# Patient Record
Sex: Male | Born: 1970
Health system: Southern US, Community
[De-identification: ages and names within clinical notes are randomized; demographics above are authoritative.]

## PROBLEM LIST (undated history)

## (undated) DIAGNOSIS — R12 Heartburn: Secondary | ICD-10-CM

## (undated) DIAGNOSIS — K219 Gastro-esophageal reflux disease without esophagitis: Secondary | ICD-10-CM

## (undated) DIAGNOSIS — M542 Cervicalgia: Secondary | ICD-10-CM

## (undated) HISTORY — PX: COLONOSCOPY: SHX174

## (undated) HISTORY — DX: Heartburn: R12

## (undated) HISTORY — PX: ESOPHAGOGASTRODUODENOSCOPY: SHX1529

## (undated) HISTORY — DX: Cervicalgia: M54.2

## (undated) HISTORY — PX: HERNIA REPAIR: SHX51

## (undated) HISTORY — DX: Gastro-esophageal reflux disease without esophagitis: K21.9

---

## 2011-08-20 ENCOUNTER — Ambulatory Visit (HOSPITAL_BASED_OUTPATIENT_CLINIC_OR_DEPARTMENT_OTHER)
Admission: RE | Admit: 2011-08-20 | Discharge: 2011-08-20 | Disposition: A | Discharge status: Home / Self Care | Payer: Managed Care, Other (non HMO) | Source: Ambulatory Visit | Attending: Family Medicine | Admitting: Family Medicine

## 2011-08-20 ENCOUNTER — Other Ambulatory Visit (HOSPITAL_BASED_OUTPATIENT_CLINIC_OR_DEPARTMENT_OTHER): Payer: Self-pay | Admitting: Family Medicine

## 2011-08-20 DIAGNOSIS — R52 Pain, unspecified: Secondary | ICD-10-CM

## 2011-08-20 DIAGNOSIS — R079 Chest pain, unspecified: Secondary | ICD-10-CM

## 2011-08-20 DIAGNOSIS — M25539 Pain in unspecified wrist: Secondary | ICD-10-CM | POA: Insufficient documentation

## 2011-08-20 DIAGNOSIS — R209 Unspecified disturbances of skin sensation: Secondary | ICD-10-CM

## 2011-08-20 DIAGNOSIS — R0789 Other chest pain: Secondary | ICD-10-CM | POA: Insufficient documentation

## 2011-09-10 ENCOUNTER — Ambulatory Visit (HOSPITAL_BASED_OUTPATIENT_CLINIC_OR_DEPARTMENT_OTHER)
Admission: RE | Admit: 2011-09-10 | Discharge: 2011-09-10 | Disposition: A | Discharge status: Home / Self Care | Payer: Managed Care, Other (non HMO) | Source: Ambulatory Visit | Attending: Family Medicine | Admitting: Family Medicine

## 2011-09-10 ENCOUNTER — Other Ambulatory Visit (HOSPITAL_BASED_OUTPATIENT_CLINIC_OR_DEPARTMENT_OTHER): Payer: Self-pay | Admitting: Family Medicine

## 2011-09-10 DIAGNOSIS — R1031 Right lower quadrant pain: Secondary | ICD-10-CM

## 2011-09-10 DIAGNOSIS — R1909 Other intra-abdominal and pelvic swelling, mass and lump: Secondary | ICD-10-CM | POA: Insufficient documentation

## 2012-12-29 IMAGING — CT CT ABD-PELV W/O CM
2 of 4 series · 15 of 46 positions shown, 17 images · non-contrast
Comparison: None.

CLINICAL DATA: Abdominal pain, right lower quadrant pain

CT ABDOMEN AND PELVIS WITHOUT CONTRAST
TECHNIQUE: Multidetector CT imaging of the abdomen and pelvis was
performed following the standard protocol without intravenous
contrast.

[Series 2: renal stone < 200 lbs 5.0 b31f · axial · 0.85mm/px · z∈[-514,-34]mm · 12 of 106 slices shown, 14 images]
[im 5/106  soft-tissue]
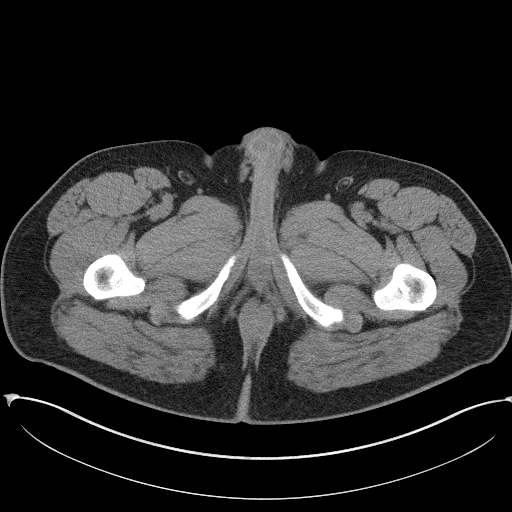
[im 5/106  bone]
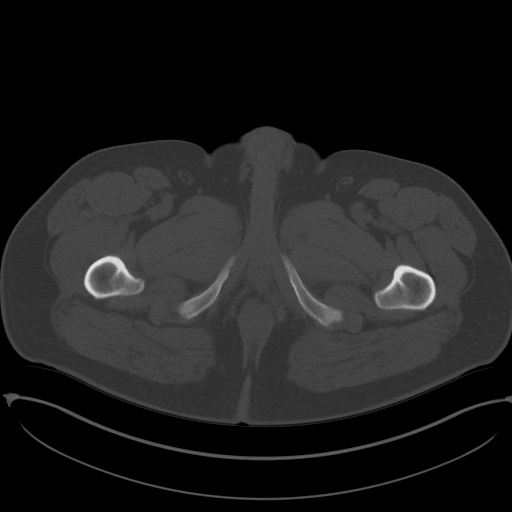
[im 14/106  soft-tissue]
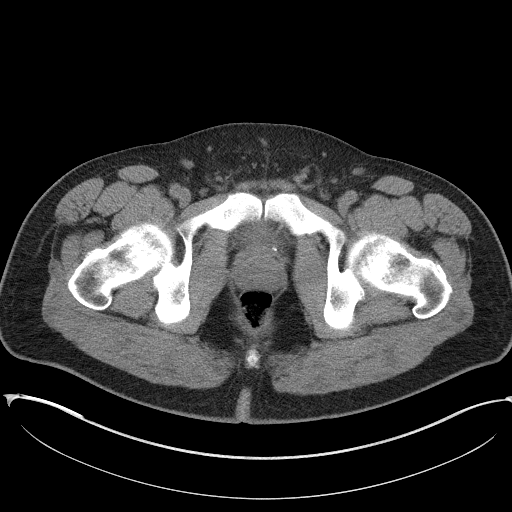
[im 22/106  soft-tissue]
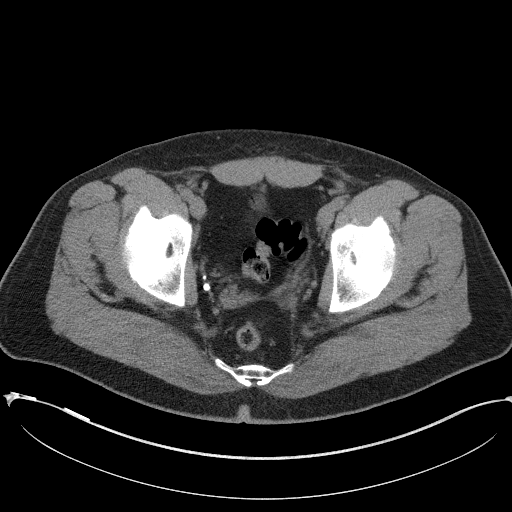
[im 31/106  soft-tissue]
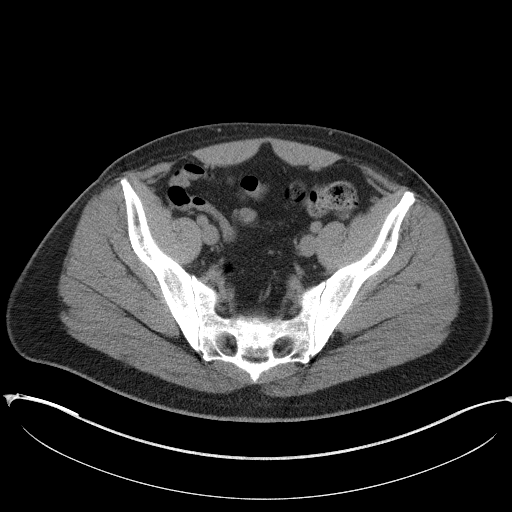
[im 40/106  soft-tissue]
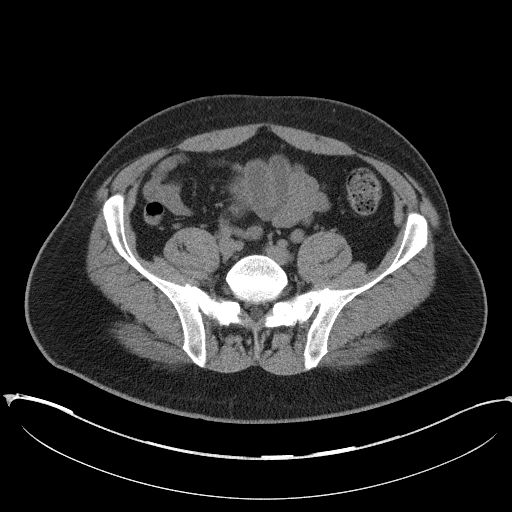
[im 49/106  soft-tissue]
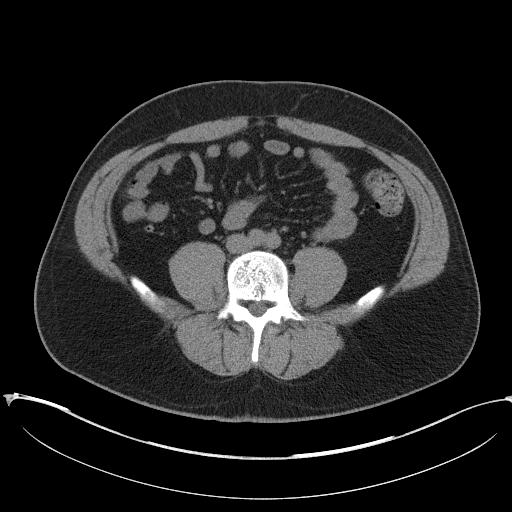
[im 57/106  soft-tissue]
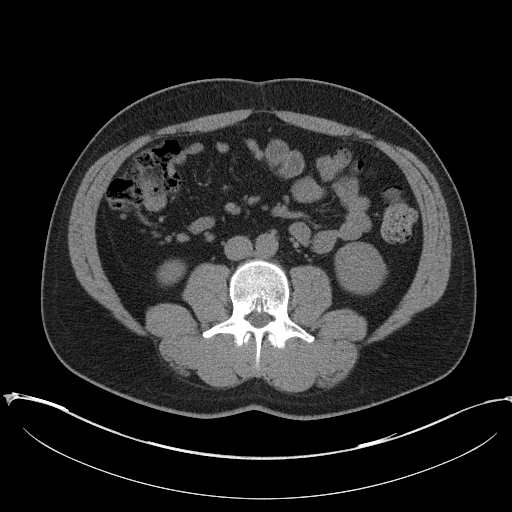
[im 66/106  soft-tissue]
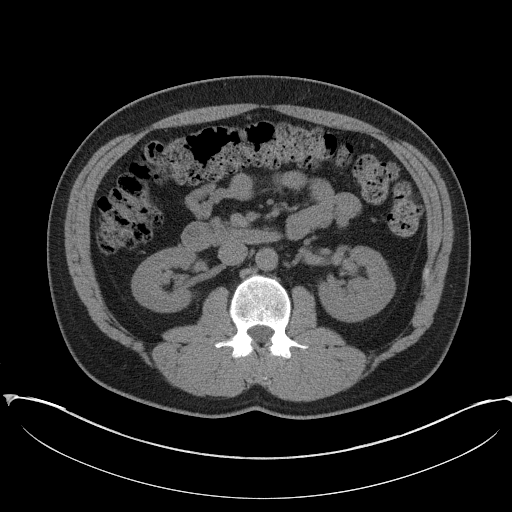
[im 75/106  soft-tissue]
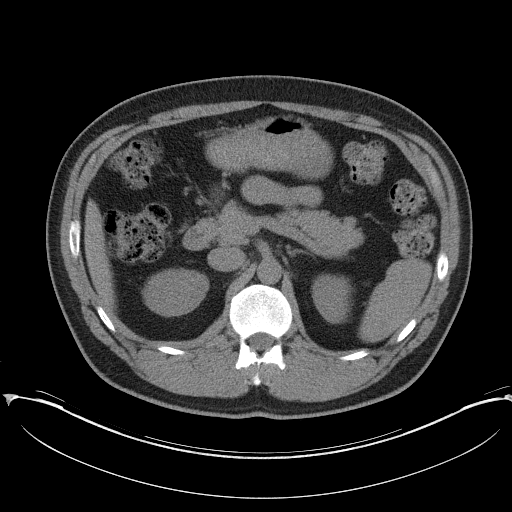
[im 75/106  bone]
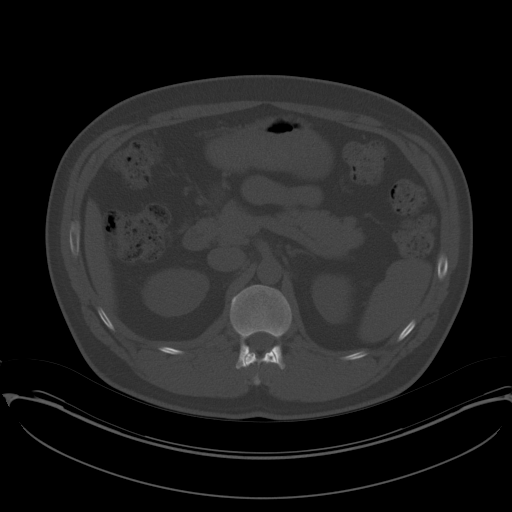
[im 84/106  soft-tissue]
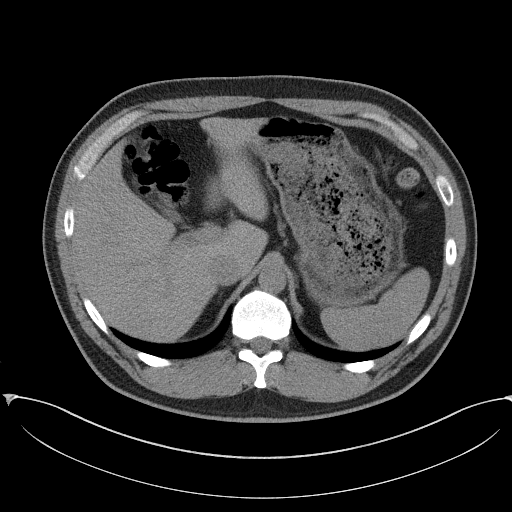
[im 92/106  soft-tissue]
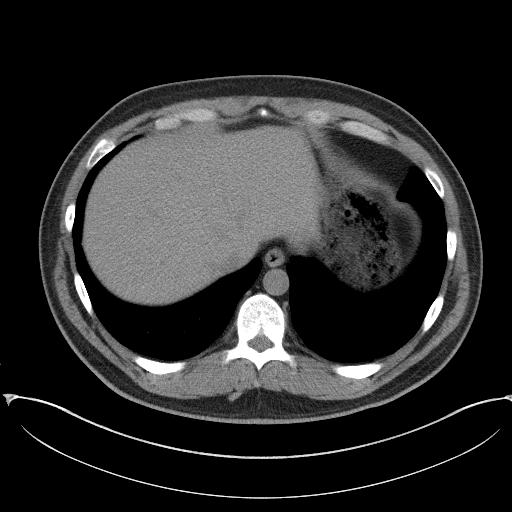
[im 101/106  soft-tissue]
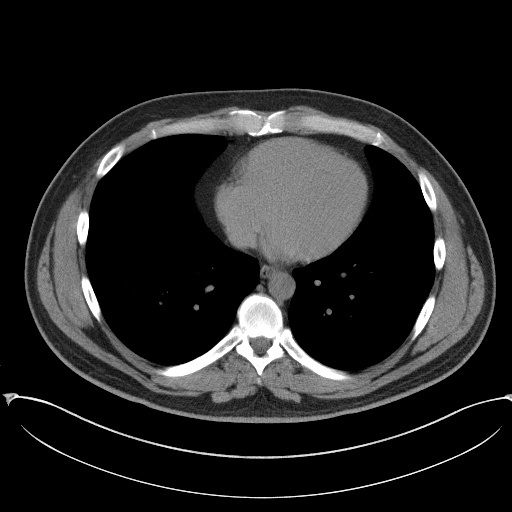

[Series 5: renal stone 3.0 coronal · coronal · 0.97mm/px · 3 of 84 slices shown]
[im 28/84  soft-tissue]
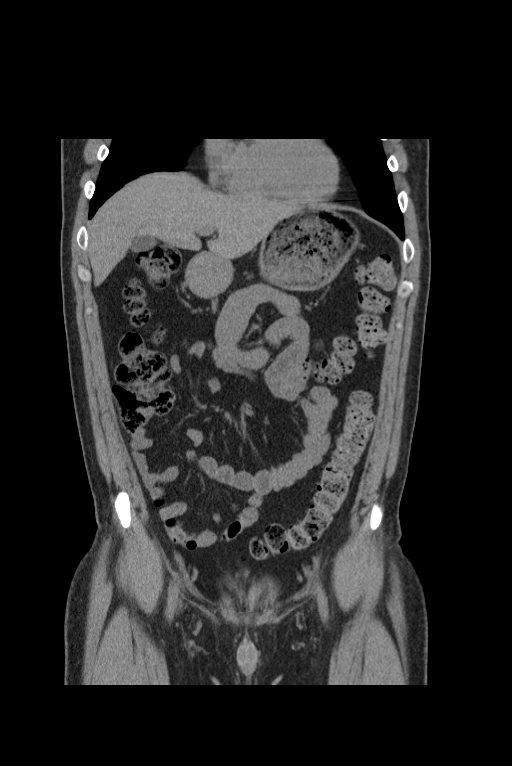
[im 37/84  soft-tissue]
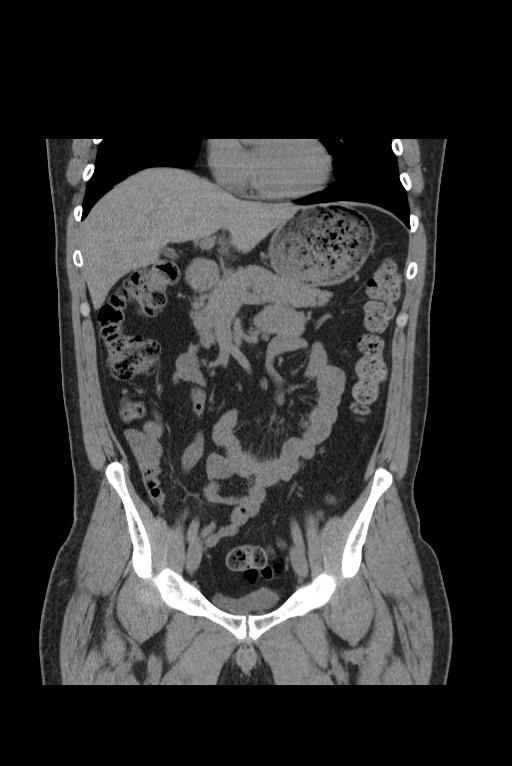
[im 47/84  soft-tissue]
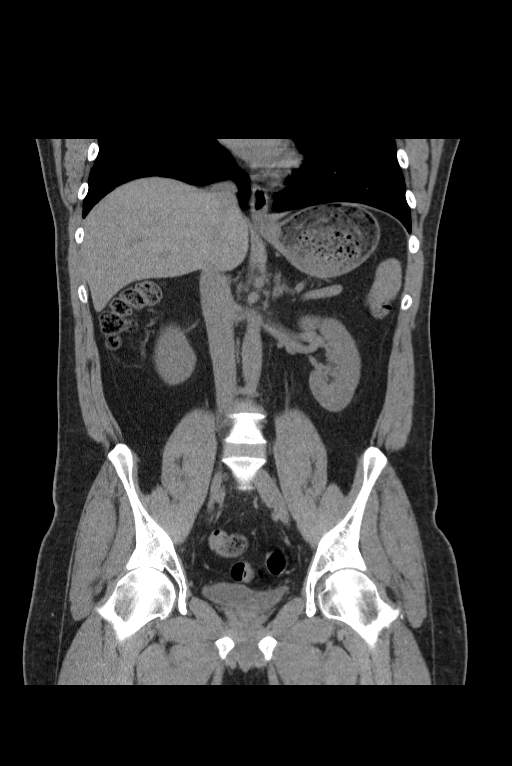

[15 of 46 positions shown; findings below may reference images not displayed]

FINDINGS: Lung bases are unremarkable.  Sagittal images of the
spine are unremarkable.  Mild disc space flattening at L5 S1 level.

Unenhanced liver is unremarkable.  Gallbladder is contracted
without evidence of calcified gallstones.  The pancreas, spleen and
adrenal glands are unremarkable.  The moderate debris probable
recent ingested food noted within stomach.

Moderate stool throughout the colon.  No aortic aneurysm.  The
unenhanced kidneys are symmetrical in size.  No nephrolithiasis.
No hydronephrosis or hydroureter.

There is a nonspecific nodule within the left upper quadrant just
lateral to the stomach and anterior to the spleen measures 6.5 mm.
This may represent a small lymph node or a small accessory
splenule.  Cannot be characterized without IV contrast.

No calcified ureteral calculi are noted bilaterally.  There is no
pericecal inflammation.  Normal appendix is clearly visualized
axial image 59.  No ascites or free air.  No small bowel
obstruction.  No adenopathy. Small nonspecific lymph nodes are
noted in the right lower quadrant mesentery the largest measures 9
x 7 mm.

The urinary bladder is under distended grossly unremarkable.
Bilateral nonspecific inguinal lymph nodes are noted.  The largest
right inguinal lymph node measures 1.2 x 0.9 cm.  The largest left
inguinal lymph node measures 1.3 x 1 cm.  Small left inguinal
scrotal canal hernia containing fat.  No destructive bony lesions
are noted within pelvis.
IMPRESSION: 1.  No nephrolithiasis.  No hydronephrosis or hydroureter.
2.  Bilateral no calcified ureteral calculi are noted.
3.  Normal appendix is clearly visualized.
4.  Moderate stool throughout the colon.
5.  There is nonspecific soft tissue nodule in the left upper
quadrant anterior to the spleen and lateral to the stomach measures
6.5 mm.  This may represent a lymph node or a small accessory
splenule.  Cannot be characterized without IV contrast.

## 2015-06-16 DIAGNOSIS — K219 Gastro-esophageal reflux disease without esophagitis: Secondary | ICD-10-CM | POA: Insufficient documentation

## 2015-06-16 DIAGNOSIS — Z800 Family history of malignant neoplasm of digestive organs: Secondary | ICD-10-CM | POA: Insufficient documentation

## 2015-07-16 DIAGNOSIS — E7800 Pure hypercholesterolemia, unspecified: Secondary | ICD-10-CM | POA: Insufficient documentation

## 2016-07-21 ENCOUNTER — Ambulatory Visit (INDEPENDENT_AMBULATORY_CARE_PROVIDER_SITE_OTHER): Payer: 59 | Admitting: Family Medicine

## 2016-07-21 ENCOUNTER — Encounter: Payer: Self-pay | Admitting: Family Medicine

## 2016-07-21 DIAGNOSIS — M542 Cervicalgia: Secondary | ICD-10-CM

## 2016-07-21 MED ORDER — METHOCARBAMOL 500 MG PO TABS: 500.0000 mg | ORAL_TABLET | Freq: Three times a day (TID) | ORAL | 1 refills | Status: DC | PRN

## 2016-07-21 MED ORDER — PREDNISONE 10 MG PO TABS: ORAL_TABLET | ORAL | 0 refills | Status: DC

## 2016-07-21 NOTE — Patient Instructions (Signed)
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 Start with prednisone dose pack - take to completion even if you're feeling better. Day AFTER finishing prednisone you can take aleve 2 tabs twice a day with food OR ibuprofen 600mg  three times a day with food for pain and inflammation. Robaxin as needed for muscle spasms - if this makes you sleepy don't take it during the day. Ok to take tylenol in addition to the above. Simple motion exercises (neck rolls, flexion/extension) just to keep this from becoming too stiff. Heat 15 minutes at a time 3-4 times a day. Call me in a week to let me know how you're doing. If not improving would consider imaging (x-rays, MRI). If improving we will consider whether or not to do physical therapy - given the nerve involved it may not help you as much as it would if this was from a pinched cervical nerve. Follow up with me in 1 month if you're doing well.

## 2016-07-22 DIAGNOSIS — M542 Cervicalgia: Secondary | ICD-10-CM | POA: Insufficient documentation

## 2016-07-22 NOTE — Progress Notes (Signed)
PCP: Nilda Simmer, MD  Subjective:   HPI: Patient is a 46 y.o. male here for right neck pain.  Patient denies known injury. He recalls working out about 6 weeks ago and noticed pain in right side of neck, collar bone the day after. Thought maybe he slept wrong. Pain has continued at 5/10 level, sharp. When he works out his program includes a lot of burpees. No radiation into arms. Has not tried anything for this. No numbness or tingling. Has been resting. No pain with swallowing, breathing. Pain worse by end of day and feels with turning neck. Causes headache as well. No bowel/bladder dysfunction.  No past medical history on file.  No current outpatient prescriptions on file prior to visit.   No current facility-administered medications on file prior to visit.     No past surgical history on file.  No Known Allergies  Social History   Social History  . Marital status: Married    Spouse name: N/A  . Number of children: N/A  . Years of education: N/A   Occupational History  . Not on file.   Social History Main Topics  . Smoking status: Never Smoker  . Smokeless tobacco: Never Used  . Alcohol use Not on file  . Drug use: Unknown  . Sexual activity: Not on file   Other Topics Concern  . Not on file   Social History Narrative  . No narrative on file    No family history on file.  BP 118/77   Pulse (!) 58   Ht 6' (1.829 m)   Wt 212 lb (96.2 kg)   BMI 28.75 kg/m   Review of Systems: See HPI above.     Objective:  Physical Exam:  Gen: NAD, comfortable in exam room  Neck: No gross deformity, swelling, bruising. TTP over trapezius, right sternocleidomastoid.  No midline/bony TTP. FROM neck - pain right lateral rotation. BUE strength 5/5.   Sensation intact to light touch.   1+ equal reflexes in triceps, biceps, brachioradialis tendons. Negative spurlings. NV intact distal BUEs.  Right shoulder: No swelling, ecchymoses.  No gross deformity. No  TTP. FROM. Negative Hawkins, Neers. Negative Yergasons. Strength 5/5 with empty can and resisted internal/external rotation. Negative apprehension. NV intact distally.   Assessment & Plan:  1. 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  Reassured.  Will start prednisone dose pack then transition to ibuprofen or aleve.  Robaxin if needed for spasms.  Simple motion exercises, heat.  Imaging if not improving over next week of the cervical spine.  F/u in 1 month otherwise.

## 2016-07-22 NOTE — Assessment & Plan Note (Signed)
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  Reassured.  Will start prednisone dose pack then transition to ibuprofen or aleve.  Robaxin if needed for spasms.  Simple motion exercises, heat.  Imaging if not improving over next week of the cervical spine.  F/u in 1 month otherwise.

## 2016-08-03 ENCOUNTER — Ambulatory Visit (INDEPENDENT_AMBULATORY_CARE_PROVIDER_SITE_OTHER): Payer: 59 | Admitting: Family Medicine

## 2016-08-03 ENCOUNTER — Encounter: Payer: Self-pay | Admitting: Family Medicine

## 2016-08-03 VITALS — BP 126/74 | HR 65 | Ht 72.0 in | Wt 212.0 lb

## 2016-08-03 DIAGNOSIS — M542 Cervicalgia: Secondary | ICD-10-CM

## 2016-08-03 NOTE — Patient Instructions (Signed)
I would recommend either physical therapy for 4-6 weeks then reevaluation. Other option would be imaging - x-rays of neck followed by MRI to assess for abnormalities (disc herniation hitting a nerve, nerve entrapment, anything unusual like a lymph node which I think would be very unlikely given your exam).

## 2016-08-04 NOTE — Assessment & Plan Note (Signed)
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  Very mild improvement initially but pain still fairly severe.  No palpable lymph nodes.  Discussed options - he would like to go ahead with MRI of cervical spine to assess for nerve impingement first.  We discussed two MRIs would likely be necessary to cover entire area of his pain (chest wall being second one).  I suspect MRIs will be normal with current symptoms.  If this is normal he would like to go ahead with physical therapy.  Robaxin if needed for spasms.  Simple motion exercises, heat.

## 2016-08-04 NOTE — Progress Notes (Addendum)
PCP: Nilda Simmer, MD  Subjective:   HPI: Patient is a 46 y.o. male here for right neck pain.  4/4: Patient denies known injury. He recalls working out about 6 weeks ago and noticed pain in right side of neck, collar bone the day after. Thought maybe he slept wrong. Pain has continued at 5/10 level, sharp. When he works out his program includes a lot of burpees. No radiation into arms. Has not tried anything for this. No numbness or tingling. Has been resting. No pain with swallowing, breathing. Pain worse by end of day and feels with turning neck. Causes headache as well. No bowel/bladder dysfunction.  4/17: Patient reports he feels a little better. Pain was improved first day especially with prednisone. Tried robaxin, still using heat. Pain is 2/10 at rest, up to 7/10 and sharp at worst. Pain posterior neck has improved - feels mainly behind right clavicle. Worse with reaching behind back with right arm but pain again behind medial right clavicle. No skin changes, numbness. No radiation into arm. No bowel/bladder dysfunction.  No past medical history on file.  Current Outpatient Prescriptions on File Prior to Visit  Medication Sig Dispense Refill  . DEXILANT 60 MG capsule     . methocarbamol (ROBAXIN) 500 MG tablet Take 1 tablet (500 mg total) by mouth every 8 (eight) hours as needed. 60 tablet 1  . omeprazole (PRILOSEC) 40 MG capsule Take by mouth.    . predniSONE (DELTASONE) 10 MG tablet 6 tabs po day 1, 5 tabs po day 2, 4 tabs po day 3, 3 tabs po day 4, 2 tabs po day 5, 1 tab po day 6 21 tablet 0   No current facility-administered medications on file prior to visit.     No past surgical history on file.  No Known Allergies  Social History   Social History  . Marital status: Married    Spouse name: N/A  . Number of children: N/A  . Years of education: N/A   Occupational History  . Not on file.   Social History Main Topics  . Smoking status: Never Smoker   . Smokeless tobacco: Never Used  . Alcohol use Not on file  . Drug use: Unknown  . Sexual activity: Not on file   Other Topics Concern  . Not on file   Social History Narrative  . No narrative on file    No family history on file.  BP 126/74   Pulse 65   Ht 6' (1.829 m)   Wt 212 lb (96.2 kg)   BMI 28.75 kg/m   Review of Systems: See HPI above.     Objective:  Physical Exam:  Gen: NAD, comfortable in exam room  Neck: No gross deformity, swelling, bruising. TTP right sternocleidomastoid, less right trapezius.  No midline/bony TTP. FROM neck - pain right lateral rotation. BUE strength 5/5.   Sensation intact to light touch.   NV intact distal BUEs.  Right shoulder: No swelling, ecchymoses.  No gross deformity. No TTP. FROM. Negative Hawkins, Neers. Negative Yergasons. Strength 5/5 with empty can and resisted internal/external rotation. NV intact distally.   Assessment & Plan:  1. 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  Very mild improvement initially but pain still fairly severe.  No palpable lymph nodes.  Discussed options - he would like to go ahead with MRI of cervical spine to assess for nerve impingement first.  We discussed two MRIs  would likely be necessary to cover entire area of his pain (chest wall being second one).  I suspect MRIs will be normal with current symptoms.  If this is normal he would like to go ahead with physical therapy.  Robaxin if needed for spasms.  Simple motion exercises, heat.    Addendum:  MRI reviewed and discussed with patient.  No evidence cervical nerve impingement.  Visualized portion of clavicle, SCM, trapezius muscles appear normal as well.  No evidence enlarged lymph nodes.  Reassured him - consistent still with stretch injury of accessory nerve.  He will start physical therapy with plans to see him back in about 6 weeks.

## 2016-09-06 ENCOUNTER — Ambulatory Visit (INDEPENDENT_AMBULATORY_CARE_PROVIDER_SITE_OTHER): Payer: 59

## 2016-09-06 DIAGNOSIS — M5031 Other cervical disc degeneration,  high cervical region: Secondary | ICD-10-CM | POA: Diagnosis not present

## 2016-09-06 DIAGNOSIS — M542 Cervicalgia: Secondary | ICD-10-CM

## 2017-05-10 DIAGNOSIS — H6123 Impacted cerumen, bilateral: Secondary | ICD-10-CM | POA: Diagnosis not present

## 2017-05-10 DIAGNOSIS — E785 Hyperlipidemia, unspecified: Secondary | ICD-10-CM | POA: Diagnosis not present

## 2017-05-10 DIAGNOSIS — Z0000 Encounter for general adult medical examination without abnormal findings: Secondary | ICD-10-CM | POA: Diagnosis not present

## 2017-05-16 ENCOUNTER — Ambulatory Visit: Payer: 59 | Admitting: Family Medicine

## 2017-05-16 ENCOUNTER — Encounter: Payer: Self-pay | Admitting: Family Medicine

## 2017-05-16 DIAGNOSIS — M542 Cervicalgia: Secondary | ICD-10-CM | POA: Diagnosis not present

## 2017-05-16 NOTE — Patient Instructions (Addendum)
Start physical therapy and do home exercises on days you don't go to therapy. Follow up with me in 1 month to 6 weeks for reevaluation. Your exam is very reassuring despite your pain. I would consider imaging of your chest wall/clavicle as next step if you're not improving.

## 2017-05-16 NOTE — Assessment & Plan Note (Signed)
Patient's exam is again reassuring.  Unusual that his pain persists along with location.  His MRI of the neck didn't show evidence of abnormality to explain his symptoms.  Would expect stretch injury of accessory nerve to have resolved by now.  We discussed further imaging (MRI of chest wall to include clavicle), physical therapy - he will try physical therapy as next step.  No evidence thoracic outlet syndrome.  Tylenol, ibuprofen if needed.  F/u in 1 month to 6 weeks.

## 2017-05-16 NOTE — Progress Notes (Signed)
PCP: [name], MD  Subjective:   HPI: Patient is a 47 y.o. male here for right neck pain.  4/4: Patient denies known injury. He recalls working out about 6 weeks ago and noticed pain in right side of neck, collar bone the day after. Thought maybe he slept wrong. Pain has continued at 5/10 level, sharp. When he works out his program includes a lot of burpees. No radiation into arms. Has not tried anything for this. No numbness or tingling. Has been resting. No pain with swallowing, breathing. Pain worse by end of day and feels with turning neck. Causes headache as well. No bowel/bladder dysfunction.  08/03/16: Patient reports he feels a little better. Pain was improved first day especially with prednisone. Tried robaxin, still using heat. Pain is 2/10 at rest, up to 7/10 and sharp at worst. Pain posterior neck has improved - feels mainly behind right clavicle. Worse with reaching behind back with right arm but pain again behind medial right clavicle. No skin changes, numbness. No radiation into arm. No bowel/bladder dysfunction.  05/16/17: Patient returns reporting he has not improved since seeing [place] last April. Pain level is 3-5/10 on right side of neck, sharp. Also pain behind right clavicle, in right pectoralis up to 3-5/10 level as well. Worse with motion of the neck especially turning to right side. No numbness, tingling into arms. No sweats, chills, fevers. No abnormal masses, swelling.  History reviewed. No pertinent past medical history.  Current Outpatient Medications on File Prior to Visit  Medication Sig Dispense Refill  . atorvastatin (LIPITOR) 20 MG tablet Take 20 mg by mouth daily.  5  . DEXILANT 60 MG capsule     . methocarbamol (ROBAXIN) 500 MG tablet Take 1 tablet (500 mg total) by mouth every 8 (eight) hours as needed. 60 tablet 1  . omeprazole (PRILOSEC) 40 MG capsule Take by mouth.    . predniSONE (DELTASONE) 10 MG tablet 6 tabs po day 1, 5  tabs po day 2, 4 tabs po day 3, 3 tabs po day 4, 2 tabs po day 5, 1 tab po day 6 21 tablet 0   No current facility-administered medications on file prior to visit.     History reviewed. No pertinent surgical history.  No Known Allergies  Social History   Socioeconomic History  . Marital status: Married    Spouse name: Not on file  . Number of children: Not on file  . Years of education: Not on file  . Highest education level: Not on file  Social Needs  . Financial resource strain: Not on file  . Food insecurity - worry: Not on file  . Food insecurity - inability: Not on file  . Transportation needs - medical: Not on file  . Transportation needs - non-medical: Not on file  Occupational History  . Not on file  Tobacco Use  . Smoking status: Never Smoker  . Smokeless tobacco: Never Used  Substance and Sexual Activity  . Alcohol use: Not on file  . Drug use: Not on file  . Sexual activity: Not on file  Other Topics Concern  . Not on file  Social History Narrative  . Not on file    History reviewed. No pertinent family history.  BP 121/75   Pulse 68   Ht 6' (1.829 m)   Wt 220 lb (99.8 kg)   BMI 29.84 kg/m   Review of Systems: See HPI above.     Objective:  Physical Exam:  Gen: NAD comfortable in exam room.  Neck: No gross deformity, swelling, bruising.  Climax joint slightly more prominent right side but no erythema, tenderness, warmth. TTP right trapezius, less of clavicle and superior pec major.  No midline/bony TTP. FROM with pain on right lateral rotation mainly. BUE strength 5/5.   Sensation intact to light touch.   1+ equal reflexes in triceps, biceps, brachioradialis tendons. Negative spurlings. NV intact distal BUEs.  Right shoulder: No swelling, ecchymoses.  No gross deformity. No TTP. FROM. Negative Hawkins, Neers. Negative Yergasons. Negative adsons. Strength 5/5 with empty can and resisted internal/external rotation. Negative  apprehension. NV intact distally.   Lymph:  No axillary, supraclavicular, epitrochlear, cervical lymphadenopathy.  Assessment & Plan:  1. Neck pain - Patient's exam is again reassuring.  Unusual that his pain persists along with location.  His MRI of the neck didn't show evidence of abnormality to explain his symptoms.  Would expect stretch injury of accessory nerve to have resolved by now.  We discussed further imaging (MRI of chest wall to include clavicle), physical therapy - he will try physical therapy as next step.  No evidence thoracic outlet syndrome.  Tylenol, ibuprofen if needed.  F/u in 1 month to 6 weeks.

## 2017-05-24 DIAGNOSIS — R293 Abnormal posture: Secondary | ICD-10-CM | POA: Diagnosis not present

## 2017-05-24 DIAGNOSIS — M6281 Muscle weakness (generalized): Secondary | ICD-10-CM | POA: Diagnosis not present

## 2017-05-24 DIAGNOSIS — M542 Cervicalgia: Secondary | ICD-10-CM | POA: Diagnosis not present

## 2017-05-26 DIAGNOSIS — M542 Cervicalgia: Secondary | ICD-10-CM | POA: Diagnosis not present

## 2017-05-26 DIAGNOSIS — M6281 Muscle weakness (generalized): Secondary | ICD-10-CM | POA: Diagnosis not present

## 2017-05-26 DIAGNOSIS — R293 Abnormal posture: Secondary | ICD-10-CM | POA: Diagnosis not present

## 2017-06-02 DIAGNOSIS — R293 Abnormal posture: Secondary | ICD-10-CM | POA: Diagnosis not present

## 2017-06-02 DIAGNOSIS — M6281 Muscle weakness (generalized): Secondary | ICD-10-CM | POA: Diagnosis not present

## 2017-06-02 DIAGNOSIS — M542 Cervicalgia: Secondary | ICD-10-CM | POA: Diagnosis not present

## 2017-08-11 ENCOUNTER — Telehealth: Payer: Self-pay | Admitting: Family Medicine

## 2017-08-11 NOTE — Telephone Encounter (Signed)
Patient called stating his neck is not doing any better. Wants to know if he can be referred to a spine specialist

## 2017-08-12 NOTE — Telephone Encounter (Signed)
I wouldn't recommend he see a neurosurgeon (typically these are the spine specialists) - he had an MRI of his neck that didn't show a cause for his pain where a shot or surgery by them would help.  I would go one of two routes: 1. We discussed imaging of his chest (MRI of the chest which would include the clavicle) 2. Neurology consult - this may include nerve testing from his neck into his arm, investigate other non-ortho/spine causes of his pain.

## 2017-08-12 NOTE — Telephone Encounter (Signed)
Voice mailbox was full.

## 2017-08-16 NOTE — Telephone Encounter (Signed)
Ok, thank you

## 2017-08-16 NOTE — Telephone Encounter (Signed)
Spoke to patient, he says the pain has gotten worse and is traveling down his shoulder. States the pain is creating problems with everyday activities like driving  Patient has made a follow up on Thursday for a re-evaluation

## 2017-08-18 ENCOUNTER — Ambulatory Visit: Payer: 59 | Admitting: Family Medicine

## 2017-08-19 ENCOUNTER — Ambulatory Visit: Payer: 59 | Admitting: Family Medicine

## 2017-08-19 ENCOUNTER — Encounter: Payer: Self-pay | Admitting: Family Medicine

## 2017-08-19 DIAGNOSIS — M542 Cervicalgia: Secondary | ICD-10-CM | POA: Diagnosis not present

## 2017-08-19 NOTE — Patient Instructions (Signed)
We will go ahead with a referral to neurology for your neck pain into the shoulder. MRI of your chest wall is a consideration but this is originating from the back part of your neck, trapezius and the MRI is likely to be normal.

## 2017-08-21 ENCOUNTER — Encounter: Payer: Self-pay | Admitting: Family Medicine

## 2017-08-21 NOTE — Progress Notes (Signed)
PCP: [name], MD  Subjective:   HPI: Patient is a 47 y.o. male here for right neck pain.  4/4: Patient denies known injury. He recalls working out about 6 weeks ago and noticed pain in right side of neck, collar bone the day after. Thought maybe he slept wrong. Pain has continued at 5/10 level, sharp. When he works out his program includes a lot of burpees. No radiation into arms. Has not tried anything for this. No numbness or tingling. Has been resting. No pain with swallowing, breathing. Pain worse by end of day and feels with turning neck. Causes headache as well. No bowel/bladder dysfunction.  08/03/16: Patient reports he feels a little better. Pain was improved first day especially with prednisone. Tried robaxin, still using heat. Pain is 2/10 at rest, up to 7/10 and sharp at worst. Pain posterior neck has improved - feels mainly behind right clavicle. Worse with reaching behind back with right arm but pain again behind medial right clavicle. No skin changes, numbness. No radiation into arm. No bowel/bladder dysfunction.  05/16/17: Patient returns reporting he has not improved since seeing [place] last April. Pain level is 3-5/10 on right side of neck, sharp. Also pain behind right clavicle, in right pectoralis up to 3-5/10 level as well. Worse with motion of the neck especially turning to right side. No numbness, tingling into arms. No sweats, chills, fevers. No abnormal masses, swelling.  5/3: Patient returns with persistent pain at 2/10 level right side of neck that is sharp and up to 6/10 with movement. Still goes into right clavicle. Tried ergonomic adjustments, new pillows without benefit. No numbness. No radiation into arm. He went to [facility] for PT for about a month but does not feel he had much benefit from this.  History reviewed. No pertinent past medical history.  Current Outpatient Medications on File Prior to Visit  Medication Sig Dispense  Refill  . atorvastatin (LIPITOR) 20 MG tablet Take 20 mg by mouth daily.  5  . DEXILANT 60 MG capsule     . omeprazole (PRILOSEC) 40 MG capsule Take by mouth.     No current facility-administered medications on file prior to visit.     History reviewed. No pertinent surgical history.  No Known Allergies  Social History   Socioeconomic History  . Marital status: Married    Spouse name: Not on file  . Number of children: Not on file  . Years of education: Not on file  . Highest education level: Not on file  Occupational History  . Not on file  Social Needs  . Financial resource strain: Not on file  . Food insecurity:    Worry: Not on file    Inability: Not on file  . Transportation needs:    Medical: Not on file    Non-medical: Not on file  Tobacco Use  . Smoking status: Never Smoker  . Smokeless tobacco: Never Used  Substance and Sexual Activity  . Alcohol use: Not on file  . Drug use: Not on file  . Sexual activity: Not on file  Lifestyle  . Physical activity:    Days per week: Not on file    Minutes per session: Not on file  . Stress: Not on file  Relationships  . Social connections:    Talks on phone: Not on file    Gets together: Not on file    Attends religious service: Not on file    Active member of club or organization:  Not on file    Attends meetings of clubs or organizations: Not on file    Relationship status: Not on file  . Intimate partner violence:    Fear of current or ex partner: Not on file    Emotionally abused: Not on file    Physically abused: Not on file    Forced sexual activity: Not on file  Other Topics Concern  . Not on file  Social History Narrative  . Not on file    History reviewed. No pertinent family history.  BP 115/82   Pulse (!) 59   Ht 6' (1.829 m)   Wt 218 lb (98.9 kg)   BMI 29.57 kg/m   Review of Systems: See HPI above.     Objective:  Physical Exam:  Gen: NAD, comfortable in exam room  Neck: No gross  deformity, swelling, bruising.  Slightly more prominent right [place] joint. TTP right trapezius.  No other tenderness.  No midline/bony TTP. FROM with pain right lateral rotation. BUE strength 5/5.   Sensation intact to light touch.   1+ equal reflexes in triceps, biceps, brachioradialis tendons. NV intact distal BUEs.  Right shoulder: No swelling, ecchymoses.  No gross deformity. No TTP. FROM. Negative Hawkins, Neers. Negative Yergasons. Strength 5/5 with empty can and resisted internal/external rotation. NV intact distally.  Assessment & Plan:  1. Neck pain - again with a reassuring exam but has pain on right side radiating into clavicle that is not responding to conservative treatment.  Did physical therapy for 1 month without benefit.  Stretch injury of accessory nerve would be expected to have improved by now.  MRI cervical spine without evidence nerve impingement.  No evidence thoracic outlet syndrome.  We discussed imaging of chest but pain is primarily in posterior neck radiating around.  Will instead refer to neurology to consider further workup, possible NCV/EMGs.

## 2017-08-21 NOTE — Assessment & Plan Note (Signed)
again with a reassuring exam but has pain on right side radiating into clavicle that is not responding to conservative treatment.  Did physical therapy for 1 month without benefit.  Stretch injury of accessory nerve would be expected to have improved by now.  MRI cervical spine without evidence nerve impingement.  No evidence thoracic outlet syndrome.  We discussed imaging of chest but pain is primarily in posterior neck radiating around.  Will instead refer to neurology to consider further workup, possible NCV/EMGs.

## 2017-08-22 NOTE — Addendum Note (Signed)
Addended by: Sherrie George F on: 08/22/2017 07:57 AM   Modules accepted: Orders

## 2017-08-25 ENCOUNTER — Encounter

## 2017-08-25 ENCOUNTER — Ambulatory Visit: Payer: 59 | Admitting: Family Medicine

## 2017-10-21 DIAGNOSIS — R220 Localized swelling, mass and lump, head: Secondary | ICD-10-CM | POA: Diagnosis not present

## 2017-10-25 ENCOUNTER — Encounter: Payer: Self-pay | Admitting: Neurology

## 2017-10-25 ENCOUNTER — Ambulatory Visit: Payer: 59 | Admitting: Neurology

## 2017-10-25 ENCOUNTER — Encounter

## 2017-10-25 VITALS — BP 122/81 | HR 68 | Ht 72.0 in | Wt 233.0 lb

## 2017-10-25 DIAGNOSIS — M542 Cervicalgia: Secondary | ICD-10-CM | POA: Diagnosis not present

## 2017-10-25 DIAGNOSIS — R079 Chest pain, unspecified: Secondary | ICD-10-CM

## 2017-10-25 NOTE — Progress Notes (Signed)
PATIENT: Dustin Holden DOB: [birth year]  Chief Complaint  Patient presents with  . Pain    Reports constant pain in neck over the last 1.5 years that radiates into his right collar bone.  Also, reporting burning pain on the left side of his chest, at the base of his ribcage.  Dustin Holden PCP    [name], MD     HISTORICAL  Dustin Holden is a 47 year old male, seen in refer by his primary care physician [name], Delaine Lame for evaluation of neck pain, radiating to left shoulder, initial evaluation was on October 25, 2017,  He complains of gradual onset left abdomen discomfort achy pain since 2013, it is constant burning aching sensation," feels like the inside the rib cage", previously had chest x-ray, pelvic and abdomen that was normal in 2013, there was no significant change over the past 6 years.  In addition, after his intense workout in February 2018, he began to noticed right neck pain, radiating pain to right anterior chest, some improvement after physical therapy, but with sudden body movement, often felt muscle tightness, scratchy sensation, especially at the inner corner of right scapular, there was no weakness, no radiating pain to right arm, hand.  He denies gait abnormality, I have personally reviewed MRI of cervical spine in May 2018, mild degenerative changes, there is no significant foraminal or canal stenosis,  REVIEW OF SYSTEMS: Full 14 system review of systems performed and notable only for as above  ALLERGIES: No Known Allergies  HOME MEDICATIONS: Current Outpatient Medications  Medication Sig Dispense Refill  . Cimetidine (TAGAMET PO) Take by mouth as needed.     No current facility-administered medications for this visit.     PAST MEDICAL HISTORY: Past Medical History:  Diagnosis Date  . Heartburn   . Neck pain     PAST SURGICAL HISTORY: Past Surgical History:  Procedure Laterality Date  . HERNIA REPAIR      FAMILY HISTORY: Family History  Problem  Relation Age of Onset  . Suicidality Mother   . Esophageal cancer Father   . Colon cancer Maternal Grandfather   . Heart attack Paternal Grandfather   . Heart disease Paternal Grandfather     SOCIAL HISTORY:  Social History   Socioeconomic History  . Marital status: Married    Spouse name: Not on file  . Number of children: 4  . Years of education: college  . Highest education level: Not on file  Occupational History  . Not on file  Social Needs  . Financial resource strain: Not on file  . Food insecurity:    Worry: Not on file    Inability: Not on file  . Transportation needs:    Medical: Not on file    Non-medical: Not on file  Tobacco Use  . Smoking status: Never Smoker  . Smokeless tobacco: Never Used  Substance and Sexual Activity  . Alcohol use: Yes    Comment: once weekly  . Drug use: Never  . Sexual activity: Not on file  Lifestyle  . Physical activity:    Days per week: Not on file    Minutes per session: Not on file  . Stress: Not on file  Relationships  . Social connections:    Talks on phone: Not on file    Gets together: Not on file    Attends religious service: Not on file    Active member of club or organization: Not on file    Attends  meetings of clubs or organizations: Not on file    Relationship status: Not on file  . Intimate partner violence:    Fear of current or ex partner: Not on file    Emotionally abused: Not on file    Physically abused: Not on file    Forced sexual activity: Not on file  Other Topics Concern  . Not on file  Social History Narrative   Lives at home with wife and children.   Left-handed.   2 cups caffeine per day.     PHYSICAL EXAM   Vitals:   10/25/17 0742  BP: 122/81  Pulse: 68  Weight: 233 lb (105.7 kg)  Height: 6' (1.829 m)    Not recorded      Body mass index is 31.6 kg/m.  PHYSICAL EXAMNIATION:  Gen: NAD, conversant, well nourised, obese, well groomed                     Cardiovascular:  Regular rate rhythm, no peripheral edema, warm, nontender. Eyes: Conjunctivae clear without exudates or hemorrhage Neck: Supple, no carotid bruits. Pulmonary: Clear to auscultation bilaterally   NEUROLOGICAL EXAM:  MENTAL STATUS: Speech:    Speech is normal; fluent and spontaneous with normal comprehension.  Cognition:     Orientation to time, place and person     Normal recent and remote memory     Normal Attention span and concentration     Normal Language, naming, repeating,spontaneous speech     Fund of knowledge   CRANIAL NERVES: CN II: Visual fields are full to confrontation. Fundoscopic exam is normal with sharp discs and no vascular changes. Pupils are round equal and briskly reactive to light. CN III, IV, VI: extraocular movement are normal. No ptosis. CN V: Facial sensation is intact to pinprick in all 3 divisions bilaterally. Corneal responses are intact.  CN VII: Face is symmetric with normal eye closure and smile. CN VIII: Hearing is normal to rubbing fingers CN IX, X: Palate elevates symmetrically. Phonation is normal. CN XI: Head turning and shoulder shrug are intact CN XII: Tongue is midline with normal movements and no atrophy.  MOTOR: There is no pronator drift of out-stretched arms. Muscle bulk and tone are normal. Muscle strength is normal.  REFLEXES: Reflexes are 2+ and symmetric at the biceps, triceps, knees, and ankles. Plantar responses are flexor.  SENSORY: Intact to light touch, pinprick, positional sensation and vibratory sensation are intact in fingers and toes.  COORDINATION: Rapid alternating movements and fine finger movements are intact. There is no dysmetria on finger-to-nose and heel-knee-shin.    GAIT/STANCE: Posture is normal. Gait is steady with normal steps, base, arm swing, and turning. Heel and toe walking are normal. Tandem gait is normal.  Romberg is absent.   DIAGNOSTIC DATA (LABS, IMAGING, TESTING) - I reviewed patient records,  labs, notes, testing and imaging myself where available.   ASSESSMENT AND PLAN  Eamonn Sermeno is a 47 y.o. male   Right neck pain, radiating pain to right shoulder. Left chest pain,  Most consistent with musculoskeletal etiology,  No evidence of cervical radiculopathy  NSAIDs as needed,  Follow-up with his primary care physician    Marcial Pacas, M.D. Ph.D.  [facility] [address], [place], [place] [address] Ph: [phone] Fax: [phone]  CC: [name], MD

## 2017-10-26 DIAGNOSIS — M9902 Segmental and somatic dysfunction of thoracic region: Secondary | ICD-10-CM | POA: Diagnosis not present

## 2017-10-26 DIAGNOSIS — M9903 Segmental and somatic dysfunction of lumbar region: Secondary | ICD-10-CM | POA: Diagnosis not present

## 2017-10-26 DIAGNOSIS — M9901 Segmental and somatic dysfunction of cervical region: Secondary | ICD-10-CM | POA: Diagnosis not present

## 2017-11-04 DIAGNOSIS — M9903 Segmental and somatic dysfunction of lumbar region: Secondary | ICD-10-CM | POA: Diagnosis not present

## 2017-11-04 DIAGNOSIS — M9901 Segmental and somatic dysfunction of cervical region: Secondary | ICD-10-CM | POA: Diagnosis not present

## 2017-11-04 DIAGNOSIS — M9902 Segmental and somatic dysfunction of thoracic region: Secondary | ICD-10-CM | POA: Diagnosis not present

## 2017-11-30 DIAGNOSIS — R0689 Other abnormalities of breathing: Secondary | ICD-10-CM | POA: Diagnosis not present

## 2017-11-30 DIAGNOSIS — R109 Unspecified abdominal pain: Secondary | ICD-10-CM | POA: Diagnosis not present

## 2017-11-30 DIAGNOSIS — R12 Heartburn: Secondary | ICD-10-CM | POA: Diagnosis not present

## 2017-12-01 DIAGNOSIS — R1012 Left upper quadrant pain: Secondary | ICD-10-CM | POA: Diagnosis not present

## 2017-12-01 DIAGNOSIS — R109 Unspecified abdominal pain: Secondary | ICD-10-CM | POA: Diagnosis not present

## 2018-01-11 ENCOUNTER — Emergency Department (HOSPITAL_BASED_OUTPATIENT_CLINIC_OR_DEPARTMENT_OTHER)
Admission: EM | Admit: 2018-01-11 | Discharge: 2018-01-11 | Disposition: A | Discharge status: Home / Self Care | Payer: 59 | Attending: Emergency Medicine | Admitting: Emergency Medicine

## 2018-01-11 ENCOUNTER — Encounter (HOSPITAL_BASED_OUTPATIENT_CLINIC_OR_DEPARTMENT_OTHER): Payer: Self-pay | Admitting: Emergency Medicine

## 2018-01-11 ENCOUNTER — Other Ambulatory Visit: Payer: Self-pay

## 2018-01-11 DIAGNOSIS — L02511 Cutaneous abscess of right hand: Secondary | ICD-10-CM | POA: Diagnosis not present

## 2018-01-11 DIAGNOSIS — M79644 Pain in right finger(s): Secondary | ICD-10-CM | POA: Diagnosis not present

## 2018-01-11 MED ORDER — DOXYCYCLINE HYCLATE 100 MG PO TABS
100.0000 mg | ORAL_TABLET | Freq: Once | ORAL | Status: AC
  Administered 2018-01-11: 100 mg via ORAL
  Filled 2018-01-11: qty 1

## 2018-01-11 MED ORDER — DOXYCYCLINE HYCLATE 100 MG PO CAPS: 100.0000 mg | ORAL_CAPSULE | Freq: Two times a day (BID) | ORAL | 0 refills | Status: DC

## 2018-01-11 MED ORDER — LIDOCAINE HCL 2 % IJ SOLN
10.0000 mL | Freq: Once | INTRAMUSCULAR | Status: AC
  Administered 2018-01-11: 200 mg
  Filled 2018-01-11: qty 20

## 2018-01-11 MED FILL — DOXYCYCLINE HYCLATE 100 MG: 100 | 10 days supply | Qty: 20 | Fill #0

## 2018-01-11 NOTE — Discharge Instructions (Addendum)
Soak your thumb in warm water several times a day. If it seems to be getting worse, then see the hand surgeon.

## 2018-01-11 NOTE — ED Provider Notes (Signed)
[place] EMERGENCY DEPARTMENT Provider Note   CSN: [id number] Arrival date & time: 01/11/18  6073     History   Chief Complaint Chief Complaint  Patient presents with  . thumb pain    HPI Dustin Holden is a 47 y.o. male.  The history is provided by the patient.  He has a history of hyperlipidemia and comes in because of a tick that is embedded underneath his right thumbnail.  He noted the black spot under his thumb yesterday, but it has become progressively more painful.  He cannot get to the take because of the overlying thumbnail.  Past Medical History:  Diagnosis Date  . Heartburn   . Neck pain     Patient Active Problem List   Diagnosis Date Noted  . Left-sided chest pain 10/25/2017  . Neck pain 07/22/2016  . Hypercholesterolemia 07/16/2015  . Esophageal reflux 06/16/2015  . Family history of colon cancer 06/16/2015  . Family history of esophageal cancer 06/16/2015    Past Surgical History:  Procedure Laterality Date  . HERNIA REPAIR          Home Medications    Prior to Admission medications   Medication Sig Start Date End Date Taking? Authorizing Provider  Cimetidine (TAGAMET PO) Take by mouth as needed.    [provider]    Family History Family History  Problem Relation Age of Onset  . Suicidality Mother   . Esophageal cancer Father   . Colon cancer Maternal Grandfather   . Heart attack Paternal Grandfather   . Heart disease Paternal Grandfather     Social History Social History   Tobacco Use  . Smoking status: Never Smoker  . Smokeless tobacco: Never Used  Substance Use Topics  . Alcohol use: Yes    Comment: once weekly  . Drug use: Never     Allergies   Patient has no known allergies.   Review of Systems Review of Systems  All other systems reviewed and are negative.    Physical Exam Updated Vital Signs BP 131/89 (BP Location: Right Arm)   Pulse 80   Temp 97.8 F (36.6 C) (Oral)   Resp 16    SpO2 100%   Physical Exam  Nursing note and vitals reviewed.  47 year old male, resting comfortably and in no acute distress. Vital signs are normal. Oxygen saturation is 100%, which is normal. Head is normocephalic and atraumatic. PERRLA, EOMI. Oropharynx is clear. Neck is nontender and supple without adenopathy or JVD. Back is nontender and there is no CVA tenderness. Lungs are clear without rales, wheezes, or rhonchi. Chest is nontender. Heart has regular rate and rhythm without murmur. Abdomen is soft, flat, nontender without masses or hepatosplenomegaly and peristalsis is normoactive. Extremities have no cyanosis or edema, full range of motion is present.  Dark area noted under the distal aspect of the right thumbnail with some surrounding erythema and slight swelling. Skin is warm and dry without rash. Neurologic: Mental status is normal, cranial nerves are intact, there are no motor or sensory deficits.  ED Treatments / Results   Procedures .Marland KitchenIncision and Drainage Date/Time: 01/11/2018 7:27 AM Performed by: Delora Fuel, MD Authorized by: Delora Fuel, MD   Consent:    Consent obtained:  Verbal   Consent given by:  Patient   Risks discussed:  Bleeding, incomplete drainage and pain   Alternatives discussed:  No treatment Location:    Type:  Abscess   Size:  Small   Location:  Upper extremity   Upper extremity location:  Finger   Finger location:  R thumb Pre-procedure details:    Skin preparation:  Chloraprep Anesthesia (see MAR for exact dosages):    Anesthesia method:  Local infiltration   Local anesthetic:  Lidocaine 2% w/o epi Procedure type:    Complexity:  Simple Procedure details:    Needle aspiration: yes     Drainage:  Purulent   Drainage amount:  Scant   Wound treatment:  Wound left open   Packing materials:  None Post-procedure details:    Patient tolerance of procedure:  Tolerated well, no immediate complications     Medications Ordered in  ED Medications  lidocaine (XYLOCAINE) 2 % (with pres) injection 200 mg (has no administration in time range)     Initial Impression / Assessment and Plan / ED Course  I have reviewed the triage vital signs and the nursing notes.  Possible tick embedded underneath the nail.  Digital block was done in the nail was excised around the discolored area.  There was some purulent drainage, but no tick identified.  It is felt that this is an early abscess which appears to have been adequately drained.  He is discharged with prescription for doxycycline, referred to hand surgery if swelling recurs.  At this time, he does not have a clinical paronychia or felon, but it is possible that this could develop into a felon..  Final Clinical Impressions(s) / ED Diagnoses   Final diagnoses:  Abscess of right thumb    ED Discharge Orders         Ordered    doxycycline (VIBRAMYCIN) 100 MG capsule  2 times daily     01/11/18 2440           Delora Fuel, MD 02/13/24 [phone]

## 2018-01-11 NOTE — ED Triage Notes (Signed)
Pt states he has a tick under his right thumb nail  Pt states he noticed it on Sunday

## 2018-01-12 DIAGNOSIS — L02511 Cutaneous abscess of right hand: Secondary | ICD-10-CM | POA: Diagnosis not present

## 2018-03-20 DIAGNOSIS — M79601 Pain in right arm: Secondary | ICD-10-CM | POA: Diagnosis not present

## 2018-03-20 DIAGNOSIS — S6991XA Unspecified injury of right wrist, hand and finger(s), initial encounter: Secondary | ICD-10-CM | POA: Diagnosis not present

## 2018-04-22 DIAGNOSIS — M79641 Pain in right hand: Secondary | ICD-10-CM | POA: Diagnosis not present

## 2018-04-26 DIAGNOSIS — W57XXXA Bitten or stung by nonvenomous insect and other nonvenomous arthropods, initial encounter: Secondary | ICD-10-CM | POA: Diagnosis not present

## 2018-04-26 DIAGNOSIS — S6991XA Unspecified injury of right wrist, hand and finger(s), initial encounter: Secondary | ICD-10-CM | POA: Diagnosis not present

## 2018-05-06 DIAGNOSIS — K642 Third degree hemorrhoids: Secondary | ICD-10-CM | POA: Diagnosis not present

## 2018-05-06 DIAGNOSIS — K649 Unspecified hemorrhoids: Secondary | ICD-10-CM | POA: Diagnosis not present

## 2018-05-10 ENCOUNTER — Encounter (INDEPENDENT_AMBULATORY_CARE_PROVIDER_SITE_OTHER): Payer: Self-pay

## 2018-05-10 ENCOUNTER — Ambulatory Visit: Payer: 59 | Admitting: Gastroenterology

## 2018-05-10 ENCOUNTER — Telehealth: Payer: Self-pay | Admitting: Gastroenterology

## 2018-05-10 ENCOUNTER — Encounter: Payer: Self-pay | Admitting: Gastroenterology

## 2018-05-10 VITALS — BP 122/82 | HR 74 | Ht 72.0 in | Wt 237.2 lb

## 2018-05-10 DIAGNOSIS — K625 Hemorrhage of anus and rectum: Secondary | ICD-10-CM | POA: Diagnosis not present

## 2018-05-10 DIAGNOSIS — K649 Unspecified hemorrhoids: Secondary | ICD-10-CM | POA: Diagnosis not present

## 2018-05-10 DIAGNOSIS — K642 Third degree hemorrhoids: Secondary | ICD-10-CM | POA: Diagnosis not present

## 2018-05-10 DIAGNOSIS — K645 Perianal venous thrombosis: Secondary | ICD-10-CM | POA: Diagnosis not present

## 2018-05-10 MED ORDER — PRAMOXINE-HC 1-2.5 % EX CREA: TOPICAL_CREAM | Freq: Three times a day (TID) | CUTANEOUS | 3 refills | Status: DC

## 2018-05-10 MED ORDER — PRAMOXINE HCL 1 % RE FOAM: 1.0000 "application " | Freq: Three times a day (TID) | RECTAL | 4 refills | Status: AC | PRN

## 2018-05-10 NOTE — Telephone Encounter (Signed)
Sent in Protofoam HC.

## 2018-05-10 NOTE — Progress Notes (Signed)
Chief Complaint: Rectal bleeding  Referring Provider:  Shawna Clamp, MD      ASSESSMENT AND PLAN;   #1. Rectal bleeeding d/t hoids. Nl CBC (Urgent care center at [facility]).  Had negative colonoscopy at age 48 at [place]. #2. FH of polyps (sister)  Plan: -Due to size, and a large clot over the hemorrhoid with active bleeding, recommend surgical evaluation.  -Use Proctofoam 1 twice daily. -Sitz bath's twice a day. -High-fiber diet -Recommend colonoscopy at the age of 75.  Have discussed risks and benefits.  Addendum-called Dr. Amalia Hailey office.  They would be able to work him in today.  I have thanked him. HPI:    Dustin Holden is a 48 y.o. male  Rectal bleeding, rectal discomfort over the last 1 week, got worse this morning when he started bleeding profusely. Denies having any significant diarrhea or constipation Did lift heavy weight (father-in-law's boat) a week ago Denies use of nonsteroidals Bleeding was quite profuse He has been wearing pads Had negative colonoscopy at the age of 79 by Dr. Alice Reichert No fever or chills Denies having any significant abdominal pain No upper GI symptoms-no nausea, vomiting, heartburn as long as he takes his medications, odynophagia or dysphagia.  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   Past Medical History:  Diagnosis Date  . GERD (gastroesophageal reflux disease)   . Heartburn   . Neck pain     Past Surgical History:  Procedure Laterality Date  . COLONOSCOPY     around 2015 [facility]  . ESOPHAGOGASTRODUODENOSCOPY     around 2016 with [facility]  . HERNIA REPAIR     ingunial hernia     Family History  Problem Relation Age of Onset  . Suicidality Mother   . Esophageal cancer Father   . Colon cancer Maternal Grandfather   . Heart attack Paternal Grandfather   . Heart disease Paternal Grandfather     Social History   Tobacco Use  . Smoking status: Never Smoker  . Smokeless tobacco: Never Used  Substance Use  Topics  . Alcohol use: Yes    Comment: once weekly  . Drug use: Never    Current Outpatient Medications  Medication Sig Dispense Refill  . Esomeprazole Magnesium (NEXIUM PO) Take 1 tablet by mouth daily.     No current facility-administered medications for this visit.     No Known Allergies  Review of Systems:  Constitutional: Denies fever, chills, diaphoresis, appetite change and fatigue.  HEENT: Denies photophobia, eye pain, redness, hearing loss, ear pain, congestion, sore throat, rhinorrhea, sneezing, mouth sores, neck pain, neck stiffness and tinnitus.   Respiratory: Denies SOB, DOE, cough, chest tightness,  and wheezing.   Cardiovascular: Denies chest pain, palpitations and leg swelling.  Genitourinary: Denies dysuria, urgency, frequency, hematuria, flank pain and difficulty urinating.  Musculoskeletal: Denies myalgias, back pain, joint swelling, arthralgias and gait problem.  Skin: No rash.  Neurological: Denies dizziness, seizures, syncope, weakness, light-headedness, numbness and headaches.  Hematological: Denies adenopathy. Easy bruising, personal or family bleeding history  Psychiatric/Behavioral: No anxiety or depression     Physical Exam:    BP 122/82   Pulse 74   Ht 6' (1.829 m)   Wt 237 lb 4 oz (107.6 kg)   BMI 32.18 kg/m  Filed Weights   05/10/18 0853  Weight: 237 lb 4 oz (107.6 kg)   Constitutional:  Well-developed, in no acute distress. Psychiatric: Normal mood and affect. Behavior is normal. HEENT: Pupils  normal.  Conjunctivae are normal. No scleral icterus. Neck supple.  Cardiovascular: Normal rate, regular rhythm. No edema Pulmonary/chest: Effort normal and breath sounds normal. No wheezing, rales or rhonchi. Abdominal: Soft, nondistended. Nontender. Bowel sounds active throughout. There are no masses palpable. No hepatomegaly. Rectal: Large ulcerated prolapsed hemorrhoid with a clot, evidence of recent active bleeding.  I did apply some  pressure. Neurological: Alert and oriented to person place and time. Skin: Skin is warm and dry. No rashes noted.    Carmell Austria, MD 05/10/2018, 9:05 AM  Cc: Shawna Clamp, MD

## 2018-05-10 NOTE — Telephone Encounter (Signed)
Missy from the pharm called and stated that pramoxine (PROCTOFOAM) 1 % foam [[id number]]  Is not covered by pt insurance however the Proctofoam HC will be cover.

## 2018-05-10 NOTE — Patient Instructions (Addendum)
If you are age 50 or older, your body mass index should be between 23-30. Your Body mass index is 32.18 kg/m. If this is out of the aforementioned range listed, please consider follow up with your Primary Care Provider.  If you are age 80 or younger, your body mass index should be between 19-25. Your Body mass index is 32.18 kg/m. If this is out of the aformentioned range listed, please consider follow up with your Primary Care Provider.   We have sent the following medications to your pharmacy for you to pick up at your convenience: Proctofoam   How to Take a Sitz Bath A sitz bath is a warm water bath that may be used to care for your rectum, genital area, or the area between your rectum and genitals (perineum). For a sitz bath, the water only comes up to your hips and covers your buttocks. A sitz bath may done at home in a bathtub or with a portable sitz bath that fits over the toilet. Your health care provider may recommend a sitz bath to help:  Relieve pain and discomfort after delivering a baby.  Relieve pain and itching from hemorrhoids or anal fissures.  Relieve pain after certain surgeries.  Relax muscles that are sore or tight. How to take a sitz bath Take 3-4 sitz baths a day, or as many as told by your health care provider. Bathtub sitz bath To take a sitz bath in a bathtub: 1. Partially fill a bathtub with warm water. The water should be deep enough to cover your hips and buttocks when you are sitting in the tub. 2. If your health care provider told you to put medicine in the water, follow his or her instructions. 3. Sit in the water. 4. Open the tub drain a little, and leave it open during your bath. 5. Turn on the warm water again, enough to replace the water that is draining out. Keep the water running throughout your bath. This helps keep the water at the right level and the right temperature. 6. Soak in the water for 15-20 minutes, or as long as told by your health care  provider. 7. When you are done, be careful when you stand up. You may feel dizzy. 8. After the sitz bath, pat yourself dry. Do not rub your skin to dry it.  Over-the-toilet sitz bath To take a sitz bath with an over-the-toilet basin: 1. Follow the manufacturer's instructions. 2. Fill the basin with warm water. 3. If your health care provider told you to put medicine in the water, follow his or her instructions. 4. Sit on the seat. Make sure the water covers your buttocks and perineum. 5. Soak in the water for 15-20 minutes, or as long as told by your health care provider. 6. After the sitz bath, pat yourself dry. Do not rub your skin to dry it. 7. Clean and dry the basin between uses. 8. Discard the basin if it cracks, or according to the manufacturer's instructions. Contact a health care provider if:  Your symptoms get worse. Do not continue with sitz baths if your symptoms get worse.  You have new symptoms. If this happens, do not continue with sitz baths until you talk with your health care provider. Summary  A sitz bath is a warm water bath in which the water only comes up to your hips and covers your buttocks.  A sitz bath may help relieve itching, relieve pain, and relax muscles that are sore or  tight in the lower part of your body, including your genital area.  Take 3-4 sitz baths a day, or as many as told by your health care provider. Soak in the water for 15-20 minutes.  Do not continue with sitz baths if your symptoms get worse. This information is not intended to replace advice given to you by your health care provider. Make sure you discuss any questions you have with your health care provider. Document Released: 12/27/2003 Document Revised: 04/07/2017 Document Reviewed: 04/07/2017 Elsevier Interactive Patient Education  2019 [place] have an appointment with Dr.Evans on 1/22/20at 2:45pm.  Thank you,  Dr. Jackquline Denmark

## 2019-03-01 ENCOUNTER — Encounter: Payer: Self-pay | Admitting: Gastroenterology

## 2020-05-02 ENCOUNTER — Ambulatory Visit (INDEPENDENT_AMBULATORY_CARE_PROVIDER_SITE_OTHER): Payer: BC Managed Care – PPO | Admitting: Gastroenterology

## 2020-05-02 ENCOUNTER — Encounter: Payer: Self-pay | Admitting: Gastroenterology

## 2020-05-02 VITALS — BP 120/78 | HR 76 | Ht 72.0 in | Wt 237.0 lb

## 2020-05-02 DIAGNOSIS — K219 Gastro-esophageal reflux disease without esophagitis: Secondary | ICD-10-CM | POA: Diagnosis not present

## 2020-05-02 DIAGNOSIS — Z8371 Family history of colonic polyps: Secondary | ICD-10-CM

## 2020-05-02 DIAGNOSIS — Z800 Family history of malignant neoplasm of digestive organs: Secondary | ICD-10-CM

## 2020-05-02 MED ORDER — ESOMEPRAZOLE MAGNESIUM 40 MG PO CPDR: 40.0000 mg | DELAYED_RELEASE_CAPSULE | Freq: Every day | ORAL | 3 refills | Status: DC

## 2020-05-02 NOTE — Progress Notes (Signed)
Chief Complaint: FU  Referring Provider:  Shawna Clamp, MD      ASSESSMENT AND PLAN;   #1. FH of polyps (sister < 65) #2. GERD despite PPI #3. FH of eso ca (dad)  Plan: -EGD/colon for further evaluation. -Nexium 20mg  po qd to continue.  He can use it twice daily if needed (especially when he goes out to eat)  Discussed risks & benefits of EGD/colon. Risks including rare perforation req laparotomy, bleeding after bx/polypectomy req blood transfusion, rarely missing neoplasms, risks of anesthesia/sedation). Benefits outweigh the risks. Patient agrees to proceed. All the questions were answered. Consent forms given for review.   HPI:    Dustin Holden is a 50 y.o. male  For follow-up  C/O reflux symptoms especially when he eats late at night or he is out with the clients.  Also when he drinks some alcohol.  He has been taking Nexium 20 mg p.o. daily, half an hour before supper.  No nocturnal symptoms.  No odynophagia or dysphagia.  Has family history of esophageal cancer (dad) and would like to get EGD performed.  No further rectal bleeding.  He denies having any diarrhea or constipation.  Has family history of colonic polyps in a first-degree relative (sis below age of 38).  He has been advised to get colonoscopy performed.  He has been taking Benefiber every day.  No nausea, vomiting, heartburn, regurgitation, odynophagia or dysphagia.  No significant diarrhea or constipation. No melena or hematochezia. No unintentional weight loss. No abdominal pain.  Had negative colonoscopy at the age of 74 by Dr. Alice Reichert.  Advised to repeat at age 78.  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   Past Medical History:  Diagnosis Date  . GERD (gastroesophageal reflux disease)   . Heartburn   . Neck pain     Past Surgical History:  Procedure Laterality Date  . COLONOSCOPY     around 2015 [facility]  . ESOPHAGOGASTRODUODENOSCOPY     around 2016 with [facility]  . HERNIA REPAIR      ingunial hernia     Family History  Problem Relation Age of Onset  . Suicidality Mother   . Esophageal cancer Father   . Colon cancer Maternal Grandfather   . Heart attack Paternal Grandfather   . Heart disease Paternal Grandfather     Social History   Tobacco Use  . Smoking status: Never Smoker  . Smokeless tobacco: Never Used  Vaping Use  . Vaping Use: Never used  Substance Use Topics  . Alcohol use: Yes    Comment: once weekly  . Drug use: Never    Current Outpatient Medications  Medication Sig Dispense Refill  . Esomeprazole Magnesium (NEXIUM PO) Take 1 tablet by mouth daily.     No current facility-administered medications for this visit.    No Known Allergies  Review of Systems:  Constitutional: Denies fever, chills, diaphoresis, appetite change and fatigue.  HEENT: Denies photophobia, eye pain, redness, hearing loss, ear pain, congestion, sore throat, rhinorrhea, sneezing, mouth sores, neck pain, neck stiffness and tinnitus.   Respiratory: Denies SOB, DOE, cough, chest tightness,  and wheezing.   Cardiovascular: Denies chest pain, palpitations and leg swelling.  Genitourinary: Denies dysuria, urgency, frequency, hematuria, flank pain and difficulty urinating.  Musculoskeletal: Denies myalgias, back pain, joint swelling, arthralgias and gait problem.  Skin: No rash.  Neurological: Denies dizziness, seizures, syncope, weakness, light-headedness, numbness and headaches.  Hematological: Denies adenopathy. Easy bruising, personal or family  bleeding history  Psychiatric/Behavioral: No anxiety or depression     Physical Exam:    BP 120/78   Pulse 76   Ht 6' (1.829 m)   Wt 237 lb (107.5 kg)   BMI 32.14 kg/m  Filed Weights   05/02/20 1120  Weight: 237 lb (107.5 kg)   Constitutional:  Well-developed, in no acute distress. Psychiatric: Normal mood and affect. Behavior is normal. HEENT: Pupils normal.  Conjunctivae are normal. No scleral icterus. Neck supple.   Cardiovascular: Normal rate, regular rhythm. No edema Pulmonary/chest: Effort normal and breath sounds normal. No wheezing, rales or rhonchi. Abdominal: Soft, nondistended. Nontender. Bowel sounds active throughout. There are no masses palpable. No hepatomegaly. Rectal: To be performed at the time of colonoscopy Neurological: Alert and oriented to person place and time. Skin: Skin is warm and dry. No rashes noted.    Carmell Austria, MD 05/02/2020, 11:49 AM  Cc: Shawna Clamp, MD

## 2020-05-02 NOTE — Patient Instructions (Signed)
It has been recommended to you by your physician that you have a(n) EGD/colonoscopy completed. Per your request, we did not schedule the procedure(s) today. Please contact our office at [phone] should you decide to have the procedure completed. You will be scheduled for a pre-visit and procedure at that time.  We will contact you for an appointment. Unable to due 06/10/20 needs at 9 am spot for double   We have sent the following medications to your pharmacy for you to pick up at your convenience: Nexium  Thank you,  Dr. Jackquline Denmark

## 2021-06-17 ENCOUNTER — Encounter: Payer: Self-pay | Admitting: Gastroenterology

## 2021-06-17 ENCOUNTER — Ambulatory Visit (INDEPENDENT_AMBULATORY_CARE_PROVIDER_SITE_OTHER): Payer: BC Managed Care – PPO | Admitting: Gastroenterology

## 2021-06-17 ENCOUNTER — Other Ambulatory Visit: Payer: Self-pay

## 2021-06-17 VITALS — BP 130/88 | HR 72 | Ht 72.0 in | Wt 233.1 lb

## 2021-06-17 DIAGNOSIS — K219 Gastro-esophageal reflux disease without esophagitis: Secondary | ICD-10-CM

## 2021-06-17 DIAGNOSIS — Z8371 Family history of colonic polyps: Secondary | ICD-10-CM

## 2021-06-17 DIAGNOSIS — Z800 Family history of malignant neoplasm of digestive organs: Secondary | ICD-10-CM

## 2021-06-17 MED ORDER — NA SULFATE-K SULFATE-MG SULF 17.5-3.13-1.6 GM/177ML PO SOLN: 1.0000 | Freq: Once | ORAL | 0 refills | Status: AC

## 2021-06-17 NOTE — Patient Instructions (Addendum)
If you are age 53 or older, your body mass index should be between 23-30. Your Body mass index is 31.62 kg/m?[name] If this is out of the aforementioned range listed, please consider follow up with your Primary Care Provider. ? ?If you are age 57 or younger, your body mass index should be between 19-25. Your Body mass index is 31.62 kg/m?[name] If this is out of the aformentioned range listed, please consider follow up with your Primary Care Provider.  ? ?________________________________________________________ ? ?The [facility] providers would like to encourage you to use [facility] to communicate with providers for non-urgent requests or questions.  Due to long hold times on the telephone, sending your provider a message by [facility] may be a faster and more efficient way to get a response.  Please allow 48 business hours for a response.  Please remember that this is for non-urgent requests.  ?_______________________________________________________ ? ?You have been scheduled for an endoscopy and colonoscopy. Please follow the written instructions given to you at your visit today. ?Please pick up your prep supplies at the pharmacy within the next 1-3 days. ?If you use inhalers (even only as needed), please bring them with you on the day of your procedure. ? ?We have sent the following medications to your pharmacy for you to pick up at your convenience: ?[place] ? ?Please call with any questions or concerns. ? ?Thank you, ? ?Dr. Jackquline Denmark ? ?

## 2021-06-17 NOTE — Progress Notes (Signed)
? ? ?Chief Complaint: FU ? ?Referring Provider:  Shawna Clamp, MD    ? ? ?ASSESSMENT AND PLAN;  ? ?#1. FH of polyps (sister < 86) ?#2. GERD despite PPI with occ dysphagia ?#3. FH of eso ca (dad) ? ?Plan: ?-EGD with dil (if needed)/colon for further evaluation. ?-Nexium 20mg  po qd to continue.  He can use it twice daily if needed (especially when he goes out to eat) ? ?Discussed risks & benefits of EGD/colon. Risks including rare perforation req laparotomy, bleeding after bx/polypectomy req blood transfusion, rarely missing neoplasms, risks of anesthesia/sedation). Benefits outweigh the risks. Patient agrees to proceed. All the questions were answered. Consent forms given for review. ? ? ?HPI:   ? ?Dustin Holden is a 51 y.o. male  ?For follow-up ?Now willing to get EGD/colonoscopy ? ?C/O reflux symptoms especially when he eats late at night or he is out with the clients.  Also when he drinks some alcohol.  He has been taking Nexium 20 mg p.o. daily, half an hour before supper.  No nocturnal symptoms.  No odynophagia or dysphagia.  Has family history of esophageal cancer (dad) and would like to get EGD performed. ? ?"Feels like lump in throat". "Feels food going down". Occ dysphagia. Has been clearing throat more after eating.  He will pay more attention to that. ? ?No further rectal bleeding.  He denies having any diarrhea or constipation.  Has family history of colonic polyps in a first-degree relative (sis below age of 63).  He has been advised to get colonoscopy performed. ? ?No nausea, vomiting, heartburn, regurgitation, odynophagia.  No significant diarrhea or constipation. No melena or hematochezia. No unintentional weight loss. No abdominal pain. ? ?Had neg colon at the age of 33 by Dr. Alice Reichert.  Advised to repeat at age 51. ? 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 ? ? ?Past Medical History:  ?Diagnosis Date  ? GERD (gastroesophageal reflux disease)   ? Heartburn   ? Neck pain   ? ? ?Past Surgical History:   ?Procedure Laterality Date  ? COLONOSCOPY    ? around 2015 [facility]  ? ESOPHAGOGASTRODUODENOSCOPY    ? around 2016 with [facility]  ? HERNIA REPAIR    ? ingunial hernia   ? ? ?Family History  ?Problem Relation Age of Onset  ? Suicidality Mother   ? Esophageal cancer Father   ? Colon cancer Maternal Grandfather   ? Heart attack Paternal Grandfather   ? Heart disease Paternal Grandfather   ? ? ?Social History  ? ?Tobacco Use  ? Smoking status: Never  ? Smokeless tobacco: Never  ?Vaping Use  ? Vaping Use: Never used  ?Substance Use Topics  ? Alcohol use: Yes  ?  Comment: once weekly  ? Drug use: Never  ? ? ?Current Outpatient Medications  ?Medication Sig Dispense Refill  ? Esomeprazole Magnesium (NEXIUM PO) Take 1 tablet by mouth daily.    ? ?No current facility-administered medications for this visit.  ? ? ?No Known Allergies ? ?Review of Systems:  ?neg ? ?  ? ?Physical Exam:   ? ?BP 130/88   Pulse 72   Ht 6' (1.829 m)   Wt 233 lb 2 oz (105.7 kg)   SpO2 99%   BMI 31.62 kg/m?  ?Filed Weights  ? 06/17/21 0842  ?Weight: 233 lb 2 oz (105.7 kg)  ? ?Constitutional:  Well-developed, in no acute distress. ?Psychiatric: Normal mood and affect. Behavior is normal. ?HEENT: Pupils normal.  Conjunctivae are normal. No scleral icterus. ?Cardiovascular: Normal rate, regular rhythm. No edema ?Pulmonary/chest: Effort normal and breath sounds normal. No wheezing, rales or rhonchi. ?Abdominal: Soft, nondistended. Nontender. Bowel sounds active throughout. There are no masses palpable. No hepatomegaly. ?Rectal: To be performed at the time of colonoscopy ?Neurological: Alert and oriented to person place and time. ?Skin: Skin is warm and dry. No rashes noted. ? ? ? ?Carmell Austria, MD 06/17/2021, 8:51 AM ? ?Cc: Dustin Clamp, MD ? ? ?

## 2021-07-14 ENCOUNTER — Telehealth: Payer: Self-pay | Admitting: Gastroenterology

## 2021-07-14 NOTE — Telephone Encounter (Signed)
Hey Dr. Lyndel Safe, ? ?Patient called in to cancel procedure 3/30 due to unexpected work trip. Patient rescheduled for 5/16. ? ?Thank you ?

## 2021-07-16 ENCOUNTER — Encounter: Payer: BC Managed Care – PPO | Admitting: Gastroenterology

## 2021-07-22 ENCOUNTER — Encounter: Payer: BC Managed Care – PPO | Admitting: Gastroenterology

## 2021-08-18 ENCOUNTER — Encounter: Payer: Self-pay | Admitting: Gastroenterology

## 2021-09-01 ENCOUNTER — Encounter: Payer: Self-pay | Admitting: Gastroenterology

## 2021-09-01 ENCOUNTER — Ambulatory Visit (AMBULATORY_SURGERY_CENTER): Payer: BC Managed Care – PPO | Admitting: Gastroenterology

## 2021-09-01 VITALS — BP 122/67 | HR 67 | Temp 98.9°F | Resp 15 | Ht 72.0 in | Wt 233.0 lb

## 2021-09-01 DIAGNOSIS — K449 Diaphragmatic hernia without obstruction or gangrene: Secondary | ICD-10-CM

## 2021-09-01 DIAGNOSIS — D120 Benign neoplasm of cecum: Secondary | ICD-10-CM

## 2021-09-01 DIAGNOSIS — Z800 Family history of malignant neoplasm of digestive organs: Secondary | ICD-10-CM | POA: Diagnosis not present

## 2021-09-01 DIAGNOSIS — D123 Benign neoplasm of transverse colon: Secondary | ICD-10-CM

## 2021-09-01 DIAGNOSIS — K2970 Gastritis, unspecified, without bleeding: Secondary | ICD-10-CM

## 2021-09-01 DIAGNOSIS — Z1211 Encounter for screening for malignant neoplasm of colon: Secondary | ICD-10-CM | POA: Diagnosis not present

## 2021-09-01 DIAGNOSIS — D125 Benign neoplasm of sigmoid colon: Secondary | ICD-10-CM | POA: Diagnosis not present

## 2021-09-01 DIAGNOSIS — K219 Gastro-esophageal reflux disease without esophagitis: Secondary | ICD-10-CM

## 2021-09-01 DIAGNOSIS — K2950 Unspecified chronic gastritis without bleeding: Secondary | ICD-10-CM | POA: Diagnosis not present

## 2021-09-01 DIAGNOSIS — Z8371 Family history of colonic polyps: Secondary | ICD-10-CM | POA: Diagnosis not present

## 2021-09-01 NOTE — Progress Notes (Signed)
Report to PACU, RN, vss, BBS= Clear.  

## 2021-09-01 NOTE — Op Note (Signed)
[facility] ?Patient Name: Dustin Holden ?Procedure Date: 09/01/2021 2:05 PM ?MRN: [id number] ?Endoscopist: Jackquline Denmark , MD ?Age: 51 ?Referring MD:  ?Date of Birth: 1970-07-01 ?Gender: Male ?Account #: [id number] ?Procedure:                Colonoscopy ?Indications:              Colon cancer screening in patient at increased  ?                          risk: Family history of 1st-degree relative with  ?                          colon polyps before age 66 years (sis) ?Medicines:                Monitored Anesthesia Care ?Procedure:                Pre-Anesthesia Assessment: ?                          - Prior to the procedure, a History and Physical  ?                          was performed, and patient medications and  ?                          allergies were reviewed. The patient's tolerance of  ?                          previous anesthesia was also reviewed. The risks  ?                          and benefits of the procedure and the sedation  ?                          options and risks were discussed with the patient.  ?                          All questions were answered, and informed consent  ?                          was obtained. Prior Anticoagulants: The patient has  ?                          taken no previous anticoagulant or antiplatelet  ?                          agents. ASA Grade Assessment: II - A patient with  ?                          mild systemic disease. After reviewing the risks  ?                          and benefits, the patient was deemed in  ?  satisfactory condition to undergo the procedure. ?                          After obtaining informed consent, the colonoscope  ?                          was passed under direct vision. Throughout the  ?                          procedure, the patient's blood pressure, pulse, and  ?                          oxygen saturations were monitored continuously. The  ?                          CF [id number] #[id number] was  introduced through the anus  ?                          and advanced to the 2 cm into the ileum. The  ?                          colonoscopy was performed without difficulty. The  ?                          patient tolerated the procedure well. The quality  ?                          of the bowel preparation was good. The terminal  ?                          ileum, ileocecal valve, appendiceal orifice, and  ?                          rectum were photographed. ?Scope In: 2:33:30 PM ?Scope Out: 2:50:34 PM ?Scope Withdrawal Time: 0 hours 13 minutes 22 seconds  ?Total Procedure Duration: 0 hours 17 minutes 4 seconds  ?Findings:                 A 2 mm polyp was found in the cecum. The polyp was  ?                          sessile. The polyp was removed with a cold biopsy  ?                          forceps. Resection and retrieval were complete. ?                          A 6 mm polyp was found in the distal transverse  ?                          colon. The polyp was sessile. The polyp was removed  ?  with a cold snare. Resection and retrieval were  ?                          complete. ?                          Non-bleeding internal hemorrhoids were found during  ?                          retroflexion. The hemorrhoids were small and Grade  ?                          I (internal hemorrhoids that do not prolapse). ?                          The terminal ileum appeared normal. ?                          The exam was otherwise without abnormality on  ?                          direct and retroflexion views. ?Complications:            No immediate complications. ?Estimated Blood Loss:     Estimated blood loss: none. ?Impression:               - One 2 mm polyp in the cecum, removed with a cold  ?                          biopsy forceps. Resected and retrieved. ?                          - One 6 mm polyp in the distal transverse colon,  ?                          removed with a cold snare. Resected and  retrieved. ?                          - Otherwise normal colonoscopy to TI. ?Recommendation:           - Patient has a contact number available for  ?                          emergencies. The signs and symptoms of potential  ?                          delayed complications were discussed with the  ?                          patient. Return to normal activities tomorrow.  ?                          Written discharge instructions were provided to the  ?  patient. ?                          - Resume previous diet. ?                          - Continue present medications. ?                          - Await pathology results. ?                          - Repeat colonoscopy in 5 years for surveillance  ?                          based on pathology results d/t FH of polyps and  ?                          above findings. ?                          - The findings and recommendations were discussed  ?                          with the patient's family. ?Jackquline Denmark, MD ?09/01/2021 2:55:15 PM ?This report has been signed electronically. ?

## 2021-09-01 NOTE — Op Note (Signed)
[facility] ?Patient Name: Dustin Holden ?Procedure Date: 09/01/2021 2:13 PM ?MRN: [id number] ?Endoscopist: Jackquline Denmark , MD ?Age: 51 ?Referring MD:  ?Date of Birth: [birth year] ?Gender: Male ?Account #: [id number] ?Procedure:                Upper GI endoscopy ?Indications:              GERD. FH eso Ca (dad) ?Medicines:                Monitored Anesthesia Care ?Procedure:                Pre-Anesthesia Assessment: ?                          - Prior to the procedure, a History and Physical  ?                          was performed, and patient medications and  ?                          allergies were reviewed. The patient's tolerance of  ?                          previous anesthesia was also reviewed. The risks  ?                          and benefits of the procedure and the sedation  ?                          options and risks were discussed with the patient.  ?                          All questions were answered, and informed consent  ?                          was obtained. Prior Anticoagulants: The patient has  ?                          taken no previous anticoagulant or antiplatelet  ?                          agents. ASA Grade Assessment: II - A patient with  ?                          mild systemic disease. After reviewing the risks  ?                          and benefits, the patient was deemed in  ?                          satisfactory condition to undergo the procedure. ?                          After obtaining informed consent, the endoscope was  ?  passed under direct vision. Throughout the  ?                          procedure, the patient's blood pressure, pulse, and  ?                          oxygen saturations were monitored continuously. The  ?                          GIF HQ190 #[id number] was introduced through the  ?                          mouth, and advanced to the second part of duodenum.  ?                          The upper GI endoscopy was  accomplished without  ?                          difficulty. The patient tolerated the procedure  ?                          well. ?Scope In: ?Scope Out: ?Findings:                 The examined esophagus was normal. Biopsies were  ?                          obtained from the proximal and distal esophagus  ?                          with cold forceps for histology of suspected  ?                          eosinophilic esophagitis. ?                          A small transient hiatal hernia was present. ?                          The entire examined stomach was normal. Biopsies  ?                          were taken with a cold forceps for histology. ?                          The examined duodenum was normal. ?Complications:            No immediate complications. ?Estimated Blood Loss:     Estimated blood loss: none. ?Impression:               - Small hiatal hernia. ?                          - Otherwise normal EGD. ?Recommendation:           - Patient has a contact number available for  ?  emergencies. The signs and symptoms of potential  ?                          delayed complications were discussed with the  ?                          patient. Return to normal activities tomorrow.  ?                          Written discharge instructions were provided to the  ?                          patient. ?                          - Resume previous diet. ?                          - Continue present medications. ?                          - Await pathology results. ?                          - Follow an antireflux regimen. ?                          - The findings and recommendations were discussed  ?                          with the patient's family. ?Jackquline Denmark, MD ?09/01/2021 2:52:21 PM ?This report has been signed electronically. ?

## 2021-09-01 NOTE — Patient Instructions (Signed)
Handouts provided on gastritis, polyps and hemorrhoids.  ? ?YOU HAD AN ENDOSCOPIC PROCEDURE TODAY AT [facility]:   Refer to the procedure report that was given to you for any specific questions about what was found during the examination.  If the procedure report does not answer your questions, please call your gastroenterologist to clarify.  If you requested that your care partner not be given the details of your procedure findings, then the procedure report has been included in a sealed envelope for you to review at your convenience later. ? ?YOU SHOULD EXPECT: Some feelings of bloating in the abdomen. Passage of more gas than usual.  Walking can help get rid of the air that was put into your GI tract during the procedure and reduce the bloating. If you had a lower endoscopy (such as a colonoscopy or flexible sigmoidoscopy) you may notice spotting of blood in your stool or on the toilet paper. If you underwent a bowel prep for your procedure, you may not have a normal bowel movement for a few days. ? ?Please Note:  You might notice some irritation and congestion in your nose or some drainage.  This is from the oxygen used during your procedure.  There is no need for concern and it should clear up in a day or so. ? ?SYMPTOMS TO REPORT IMMEDIATELY: ? ?Following lower endoscopy (colonoscopy or flexible sigmoidoscopy): ? Excessive amounts of blood in the stool ? Significant tenderness or worsening of abdominal pains ? Swelling of the abdomen that is new, acute ? Fever of 100?F or higher ? ?Following upper endoscopy (EGD) ? Vomiting of blood or coffee ground material ? New chest pain or pain under the shoulder blades ? Painful or persistently difficult swallowing ? New shortness of breath ? Fever of 100?F or higher ? Black, tarry-looking stools ? ?For urgent or emergent issues, a gastroenterologist can be reached at any hour by calling [phone]. ?Do not use MyChart messaging for urgent concerns.   ? ? ?DIET:  We do recommend a small meal at first, but then you may proceed to your regular diet.  Drink plenty of fluids but you should avoid alcoholic beverages for 24 hours. ? ?ACTIVITY:  You should plan to take it easy for the rest of today and you should NOT DRIVE or use heavy machinery until tomorrow (because of the sedation medicines used during the test).   ? ?FOLLOW UP: ?Our staff will call the number listed on your records 48-72 hours following your procedure to check on you and address any questions or concerns that you may have regarding the information given to you following your procedure. If we do not reach you, we will leave a message.  We will attempt to reach you two times.  During this call, we will ask if you have developed any symptoms of COVID 19. If you develop any symptoms (ie: fever, flu-like symptoms, shortness of breath, cough etc.) before then, please call [phone].  If you test positive for Covid 19 in the 2 weeks post procedure, please call and report this information to [place].   ? ?If any biopsies were taken you will be contacted by phone or by letter within the next 1-3 weeks.  Please call us at [phone] if you have not heard about the biopsies in 3 weeks.  ? ? ?SIGNATURES/CONFIDENTIALITY: ?You and/or your care partner have signed paperwork which will be entered into your electronic medical record.  These signatures attest to the  fact that that the information above on your After Visit Summary has been reviewed and is understood.  Full responsibility of the confidentiality of this discharge information lies with you and/or your care-partner. ? ?

## 2021-09-01 NOTE — Progress Notes (Signed)
Chief Complaint: FU  Referring Provider:  Shawna Clamp, MD      ASSESSMENT AND PLAN;   #1. FH of polyps (sister < 47) #2. GERD despite PPI with occ dysphagia #3. FH of eso ca (dad)  Plan: -EGD with dil (if needed)/colon for further evaluation. -Nexium '20mg'$  po qd to continue.  He can use it twice daily if needed (especially when he goes out to eat)  Discussed risks & benefits of EGD/colon. Risks including rare perforation req laparotomy, bleeding after bx/polypectomy req blood transfusion, rarely missing neoplasms, risks of anesthesia/sedation). Benefits outweigh the risks. Patient agrees to proceed. All the questions were answered. Consent forms given for review.   HPI:    Dustin Holden is a 51 y.o. male  For follow-up Now willing to get EGD/colonoscopy  C/O reflux symptoms especially when he eats late at night or he is out with the clients.  Also when he drinks some alcohol.  He has been taking Nexium 20 mg p.o. daily, half an hour before supper.  No nocturnal symptoms.  No odynophagia or dysphagia.  Has family history of esophageal cancer (dad) and would like to get EGD performed.  "Feels like lump in throat". "Feels food going down". Occ dysphagia. Has been clearing throat more after eating.  He will pay more attention to that.  No further rectal bleeding.  He denies having any diarrhea or constipation.  Has family history of colonic polyps in a first-degree relative (sis below age of 47).  He has been advised to get colonoscopy performed.  No nausea, vomiting, heartburn, regurgitation, odynophagia.  No significant diarrhea or constipation. No melena or hematochezia. No unintentional weight loss. No abdominal pain.  Had neg colon at the age of 89 by Dr. Alice Reichert.  Advised to repeat at age 36.  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   Past Medical History:  Diagnosis Date   GERD (gastroesophageal reflux disease)    Heartburn    Neck pain     Past Surgical History:   Procedure Laterality Date   COLONOSCOPY     around 2015 [facility]   ESOPHAGOGASTRODUODENOSCOPY     around 2016 with [facility]   HERNIA REPAIR     ingunial hernia     Family History  Problem Relation Age of Onset   Suicidality Mother    Esophageal cancer Father    Colon cancer Maternal Grandfather    Heart attack Paternal Grandfather    Heart disease Paternal Grandfather     Social History   Tobacco Use   Smoking status: Never   Smokeless tobacco: Never  Vaping Use   Vaping Use: Never used  Substance Use Topics   Alcohol use: Yes    Comment: once weekly   Drug use: Never    Current Outpatient Medications  Medication Sig Dispense Refill   Esomeprazole Magnesium (NEXIUM PO) Take 1 tablet by mouth daily.     No current facility-administered medications for this visit.    No Known Allergies  Review of Systems:  neg     Physical Exam:    BP (!) 143/88   Pulse 66   Temp 98.9 F (37.2 C)   Ht 6' (1.829 m)   Wt 233 lb (105.7 kg)   SpO2 98%   BMI 31.60 kg/m  Filed Weights   09/01/21 1350  Weight: 233 lb (105.7 kg)   Constitutional:  Well-developed, in no acute distress. Psychiatric: Normal mood and affect. Behavior is normal.  HEENT: Pupils normal.  Conjunctivae are normal. No scleral icterus. Cardiovascular: Normal rate, regular rhythm. No edema Pulmonary/chest: Effort normal and breath sounds normal. No wheezing, rales or rhonchi. Abdominal: Soft, nondistended. Nontender. Bowel sounds active throughout. There are no masses palpable. No hepatomegaly. Rectal: To be performed at the time of colonoscopy Neurological: Alert and oriented to person place and time. Skin: Skin is warm and dry. No rashes noted.    Carmell Austria, MD 09/01/2021, 2:13 PM  Cc: Shawna Clamp, MD

## 2021-09-03 ENCOUNTER — Telehealth: Payer: Self-pay | Admitting: *Deleted

## 2021-09-03 ENCOUNTER — Telehealth: Payer: Self-pay

## 2021-09-03 NOTE — Telephone Encounter (Signed)
  Follow up Call-     09/01/2021    1:50 PM  Call back number  Post procedure Call Back phone  # [phone]  Permission to leave phone message Yes     Patient questions:  Message left to call us if necessary.

## 2021-09-03 NOTE — Telephone Encounter (Signed)
Left message on follow up call. 

## 2021-09-08 ENCOUNTER — Encounter: Payer: Self-pay | Admitting: Gastroenterology

## 2024-03-08 ENCOUNTER — Emergency Department (HOSPITAL_BASED_OUTPATIENT_CLINIC_OR_DEPARTMENT_OTHER)

## 2024-03-08 ENCOUNTER — Emergency Department (HOSPITAL_BASED_OUTPATIENT_CLINIC_OR_DEPARTMENT_OTHER)
Admission: EM | Admit: 2024-03-08 | Discharge: 2024-03-08 | Disposition: A | Discharge status: Home / Self Care | Attending: Emergency Medicine | Admitting: Emergency Medicine

## 2024-03-08 ENCOUNTER — Encounter (HOSPITAL_BASED_OUTPATIENT_CLINIC_OR_DEPARTMENT_OTHER): Payer: Self-pay

## 2024-03-08 ENCOUNTER — Other Ambulatory Visit: Payer: Self-pay

## 2024-03-08 DIAGNOSIS — R600 Localized edema: Secondary | ICD-10-CM

## 2024-03-08 DIAGNOSIS — R220 Localized swelling, mass and lump, head: Secondary | ICD-10-CM | POA: Insufficient documentation

## 2024-03-08 DIAGNOSIS — D72829 Elevated white blood cell count, unspecified: Secondary | ICD-10-CM | POA: Diagnosis not present

## 2024-03-08 DIAGNOSIS — R6884 Jaw pain: Secondary | ICD-10-CM | POA: Diagnosis present

## 2024-03-08 LAB — CBC WITH DIFFERENTIAL/PLATELET
Abs Immature Granulocytes: 0.04 K/uL (ref 0.00–0.07)
Basophils Absolute: 0.1 K/uL (ref 0.0–0.1)
Basophils Relative: 1 %
Eosinophils Absolute: 0.2 K/uL (ref 0.0–0.5)
Eosinophils Relative: 2 %
HCT: 46.3 % (ref 39.0–52.0)
Hemoglobin: 15.8 g/dL (ref 13.0–17.0)
Immature Granulocytes: 0 %
Lymphocytes Relative: 22 %
Lymphs Abs: 2.7 K/uL (ref 0.7–4.0)
MCH: 31.2 pg (ref 26.0–34.0)
MCHC: 34.1 g/dL (ref 30.0–36.0)
MCV: 91.5 fL (ref 80.0–100.0)
Monocytes Absolute: 1.2 K/uL — ABNORMAL HIGH (ref 0.1–1.0)
Monocytes Relative: 10 %
Neutro Abs: 7.7 K/uL (ref 1.7–7.7)
Neutrophils Relative %: 65 %
Platelets: 289 K/uL (ref 150–400)
RBC: 5.06 MIL/uL (ref 4.22–5.81)
RDW: 12.4 % (ref 11.5–15.5)
WBC: 11.9 K/uL — ABNORMAL HIGH (ref 4.0–10.5)
nRBC: 0 % (ref 0.0–0.2)

## 2024-03-08 LAB — COMPREHENSIVE METABOLIC PANEL WITH GFR
ALT: 20 U/L (ref 0–44)
AST: 24 U/L (ref 15–41)
Albumin: 4.7 g/dL (ref 3.5–5.0)
Alkaline Phosphatase: 52 U/L (ref 38–126)
Anion gap: 12 (ref 5–15)
BUN: 12 mg/dL (ref 6–20)
CO2: 26 mmol/L (ref 22–32)
Calcium: 9.3 mg/dL (ref 8.9–10.3)
Chloride: 103 mmol/L (ref 98–111)
Creatinine, Ser: 0.82 mg/dL (ref 0.61–1.24)
GFR, Estimated: >60 mL/min (ref >60)
Glucose, Bld: 92 mg/dL (ref 70–99)
Potassium: 4 mmol/L (ref 3.5–5.1)
Sodium: 140 mmol/L (ref 135–145)
Total Bilirubin: 0.4 mg/dL (ref 0.0–1.2)
Total Protein: 7.8 g/dL (ref 6.5–8.1)

## 2024-03-08 MED ORDER — PIPERACILLIN-TAZOBACTAM 3.375 G IVPB 30 MIN
3.3750 g | Freq: Once | INTRAVENOUS | Status: AC
  Administered 2024-03-08: 3.375 g via INTRAVENOUS
  Filled 2024-03-08: qty 50

## 2024-03-08 MED ORDER — AMOXICILLIN-POT CLAVULANATE 875-125 MG PO TABS: 1.0000 | ORAL_TABLET | Freq: Two times a day (BID) | ORAL | 0 refills | Status: AC

## 2024-03-08 MED ORDER — DEXAMETHASONE SODIUM PHOSPHATE 4 MG/ML IJ SOLN
4.0000 mg | Freq: Once | INTRAMUSCULAR | Status: AC
  Administered 2024-03-08: 4 mg via INTRAVENOUS
  Filled 2024-03-08: qty 1

## 2024-03-08 MED ORDER — SODIUM CHLORIDE 0.9 % IV SOLN: INTRAVENOUS | Status: DC | PRN

## 2024-03-08 MED ORDER — IOHEXOL 300 MG/ML  SOLN
80.0000 mL | Freq: Once | INTRAMUSCULAR | Status: AC | PRN
  Administered 2024-03-08: 100 mL via INTRAVENOUS

## 2024-03-08 NOTE — ED Provider Notes (Signed)
  EMERGENCY DEPARTMENT AT [facility] Provider Note   CSN: [id number] Arrival date & time: 03/08/24  1414     Patient presents with: Jaw Pain   Dustin Holden is a 53 y.o. male who presents with left-sided facial swelling that began this afternoon.  He reports experiencing similar mild swelling on the same side approximately 1 week ago, which resolved spontaneously.  Patient has no instigating event for swelling.  Today the swelling returned and has persisted.  He describes the area as tender to palpation.  He denies ear pain, throat pain, cough, fever, trauma, dental pain, or vision changes.  No recent medication changes.  Patient reports mild discomfort when opening his mouth but denies difficulty opening it fully, denies difficulty speaking, and denies trouble swallowing or managing secretions.   HPI     Prior to Admission medications   Medication Sig Start Date End Date Taking? Authorizing Provider  amoxicillin -clavulanate (AUGMENTIN ) 875-125 MG tablet Take 1 tablet by mouth 2 (two) times daily for 14 days. 03/08/24 03/22/24 Yes Alden Bensinger L, PA  Esomeprazole  Magnesium  (NEXIUM  PO) Take 1 tablet by mouth daily.    [provider]    Allergies: Patient has no known allergies.    Review of Systems  HENT:  Positive for facial swelling.     Updated Vital Signs BP 122/80   Pulse 77   Temp 97.7 F (36.5 C)   Resp 15   Ht 6' (1.829 m)   Wt 103.4 kg   SpO2 96%   BMI 30.92 kg/m   Physical Exam Vitals and nursing note reviewed.  Constitutional:      General: He is not in acute distress.    Appearance: Normal appearance.  HENT:     Head: Normocephalic and atraumatic.     Comments: Significant soft tissue swelling over the left perimandibular region.  Area is tender to palpation.  No erythema, warmth, induration, fluctuance, crepitus, or signs of cellulitis.  No purulent drainage.  No trismus-patient opens mouth fully without restriction. Eyes:      General: Lids are normal. Vision grossly intact.     Extraocular Movements: Extraocular movements intact.     Conjunctiva/sclera: Conjunctivae normal.     Pupils: Pupils are equal, round, and reactive to light.     Comments: No periorbital swelling.  Extraocular movements intact.  No conjunctival injection.  Neck:     Comments: No cervical swelling or lymphadenopathy. Full range of motion without pain. Cardiovascular:     Rate and Rhythm: Normal rate and regular rhythm.     Pulses: Normal pulses.     Comments: Patient has no difficulty speaking complete sentences. Pulmonary:     Effort: Pulmonary effort is normal. No respiratory distress.  Musculoskeletal:        General: Normal range of motion.     Cervical back: Normal range of motion.  Skin:    General: Skin is warm and dry.     Capillary Refill: Capillary refill takes less than 2 seconds.  Neurological:     General: No focal deficit present.     Mental Status: He is alert. Mental status is at baseline.  Psychiatric:        Mood and Affect: Mood normal.     (all labs ordered are listed, but only abnormal results are displayed) Labs Reviewed  CBC WITH DIFFERENTIAL/PLATELET - Abnormal; Notable for the following components:      Result Value   WBC 11.9 (*)  Monocytes Absolute 1.2 (*)    All other components within normal limits  COMPREHENSIVE METABOLIC PANEL WITH GFR    EKG: None  Radiology: CT Soft Tissue Neck W Contrast Result Date: 03/08/2024 EXAM: CT NECK WITH CONTRAST 03/08/2024 06:35:31 PM TECHNIQUE: CT of the neck was performed with the administration of intravenous contrast. Multiplanar reformatted images are provided for review. Automated exposure control, iterative reconstruction, and/or weight based adjustment of the mA/kV was utilized to reduce the radiation dose to as low as reasonably achievable. COMPARISON: None available. CLINICAL HISTORY: left side neck/face swelling FINDINGS: AERODIGESTIVE TRACT: No  discrete mass. No edema. SALIVARY GLANDS: The parotid and submandibular glands are unremarkable. THYROID: Unremarkable. LYMPH NODES: No suspicious cervical lymphadenopathy. SOFT TISSUES: Edema overlying the left mandible and tracking along the platysma. Discrete , drainable fluid collection. SABRA BRAIN, ORBITS, SINUSES AND MASTOIDS: Mild paranasal sinus mucosal thickening. Otherwise, No acute abnormality. LUNGS AND MEDIASTINUM: No acute abnormality. BONES: No focal bone abnormality. IMPRESSION: 1. Nonspecific edema overlying the left mandible and tracking along the platysma. 2. The left parotid gland may be slightly larger than the left, but is otherwise within normal limits. No definite evidence of parotiditis. 3. No discrete, drainable fluid collection. Electronically signed by: Gilmore Molt MD 03/08/2024 07:45 PM EST RP Workstation: [id number]     Procedures   Medications Ordered in the ED  piperacillin -tazobactam (ZOSYN ) IVPB 3.375 g (0 g Intravenous Stopped 03/08/24 1936)  dexamethasone  (DECADRON ) injection 4 mg (4 mg Intravenous Given 03/08/24 1723)  iohexol  (OMNIPAQUE ) 300 MG/ML solution 80 mL (100 mLs Intravenous Contrast Given 03/08/24 1819)                                    Medical Decision Making Amount and/or Complexity of Data Reviewed Labs: ordered. Radiology: ordered.  Risk Prescription drug management.   Patient presents to the ED for concern of facial swelling, this involves an extensive number of treatment options, and is a complaint that carries with it a high risk of complications and morbidity.    The differential diagnosis includes: Salivary gland etiology Dental etiology Other infectious etiology Traumatic etiology   The patient was a reliable historian, providing a clear, detailed, and consistent account of the presenting symptoms and relevant medical history. The information was obtained directly from the patient and statements were documented in the  patient's own words when possible. No discrepancies were noted between the history provided and available collateral sources.     Lab Tests: I ordered, and personally interpreted labs.   The pertinent results include:  CMP -WNL CBC -elevated WBC 11.9  Imaging Studies ordered: I ordered imaging studies including: CT soft tissue neck with contrast I independently visualized and interpreted imaging which showed: Nonspecific edema overlying the left mandible and tracking along the platysma. The left parotid gland may be slightly larger than the left, but is otherwise within normal limits. No definite evidence of parotiditis. No discrete, drainable fluid collection. I agree with the radiologist interpretation  Medicines ordered and prescription drug management: I ordered medications: Zosyn  Decadron  Reevaluation of the patient after these medicines showed that the patient improved I have reviewed the patients home medicines and have made adjustments as needed  Problem List / ED Course: Problem List: Left sided facial swelling Emergency Department Course: The patient presented with left facial swelling. Initial assessment included history, physical exam, and review of prior medical records.  Given concerning localized swelling  without clear source, patient was treated empirically with Zosyn  for broad-spectrum coverage and Decadron  for inflammation reduction.  CT soft tissue neck with contrast was obtained to evaluate for deep space infection, abscess formation, salivary gland involvement, or mandibular pathology.  Imaging showed nonspecific edema overlying the left mandible tracking along the platysma with slightly prominent but overall normal parotid gland, no drainable abscess, no evidence of parotitis or deep space infection.  Laboratory evaluation revealed CBC with mild leukocytosis.  Throughout this ED course the patient remained afebrile, nontachycardic, hemodynamically stable, and did not  appear to be in distress.  He tolerated medications without complication.  Airway remained stable with no dysphagia or respiratory concerns. Reevaluation: After the interventions noted above, I reevaluated the patient and found that they have :improved  Dispostion: Given reassuring imaging, stable vitals, and absence of abscess or deep space infection, the patient was deemed appropriate for outpatient management.  He was discharged with outpatient antibiotic regimen, strict return precautions, and instructions for close follow-up with his primary care provider for reassessment of swelling and response to therapy.   Clinical Assessment:    Working diagnosis: Facial edema Disposition Plan: The patient is medically stable for discharge from the Emergency Department at this time. Vital signs are within normal limits, and the patient is alert, oriented, and in no acute distress. Evaluation has been completed with no findings necessitating hospital admission or further emergent workup.  Communication:   Patient informed of disposition decision and rationale. Questions addressed. The results and clinical impression were discussed with the patient at bedside and the patient demonstrated understanding.     Final diagnoses:  Facial edema    ED Discharge Orders          Ordered    amoxicillin -clavulanate (AUGMENTIN ) 875-125 MG tablet  2 times daily        03/08/24 2000               Justyce Yeater L, [place] 03/10/24 0131    Patt Alm Macho, MD 03/10/24 [phone]

## 2024-03-08 NOTE — Discharge Instructions (Addendum)
 Thank you for visiting the Emergency Department today. It was a pleasure to be part of your healthcare team.  You were seen today for left-sided facial swelling.   You have been treated with IV antibiotics, and prescribed an outpatient antibiotic regimen -  please finish the entire antibiotic course.   If you have any questions about your medicines, please call your pharmacy or healthcare provider. As discussed, it is important to watch for warning signs such as worsening pain, swelling, fever, trouble breathing. If any of these happen, return to the Emergency Department or call 911. Thank you for trusting us  with your health.

## 2024-03-08 NOTE — ED Notes (Signed)
 D/c paperwork reviewed with pt, including prescriptions and follow up care.  All questions and/or concerns addressed at time of d/c.  No further needs expressed. . Pt verbalized understanding, Ambulatory without assistance to ED exit, NAD.

## 2024-03-08 NOTE — ED Triage Notes (Signed)
 Pt states that he has left jaw pain and swelling. States that he noted it last week and thought that it was getting better but today he felt a sharp pain. Went to [facility] and they sent him here. States it hurts to open to his mouth.

## 2024-03-22 ENCOUNTER — Telehealth: Payer: Self-pay | Admitting: Gastroenterology

## 2024-03-22 NOTE — Telephone Encounter (Signed)
 Good morning Dr. Charlanne,   Patient called stating that he wanted to schedule his colonoscopy and EGD. Looking at his chart he does not have a recall for EGD and is not due for colonoscopy until May of 2025. He states he though the was supposed to come back every 2 years. Requesting you review his chart ,due to his dad having Esophagus cancer.    Please advise.

## 2024-05-06 NOTE — Telephone Encounter (Signed)
 Pt having GERD as per notes  Please schedule for EGD/colon RG

## 2024-05-24 NOTE — Telephone Encounter (Signed)
 Spoke with patient he states he would like to schedule for April, patient will call back to schedule.
